# Patient Record
Sex: Female | Born: 1965 | Race: White | Hispanic: No | Marital: Married | Smoking: Former smoker
Health system: Southern US, Community
[De-identification: ages and names within clinical notes are randomized; demographics above are authoritative.]

## PROBLEM LIST (undated history)

## (undated) ENCOUNTER — Emergency Department: Admission: EM | Discharge status: Absent without leave | Payer: Self-pay | Source: Home / Self Care

## (undated) HISTORY — PX: TONSILLECTOMY: SUR1361

## (undated) HISTORY — PX: OTHER SURGICAL HISTORY: SHX169

## (undated) HISTORY — PX: ABDOMINAL HYSTERECTOMY: SHX81

## (undated) HISTORY — PX: BREAST LUMPECTOMY: SHX2

---

## 1998-02-19 ENCOUNTER — Other Ambulatory Visit: Admission: RE | Admit: 1998-02-19 | Discharge: 1998-02-19 | Payer: Self-pay | Admitting: Obstetrics and Gynecology

## 1999-02-20 ENCOUNTER — Other Ambulatory Visit: Admission: RE | Admit: 1999-02-20 | Discharge: 1999-02-20 | Payer: Self-pay | Admitting: Obstetrics and Gynecology

## 2000-02-23 ENCOUNTER — Other Ambulatory Visit: Admission: RE | Admit: 2000-02-23 | Discharge: 2000-02-23 | Payer: Self-pay | Admitting: Obstetrics and Gynecology

## 2000-11-09 ENCOUNTER — Encounter (INDEPENDENT_AMBULATORY_CARE_PROVIDER_SITE_OTHER): Payer: Self-pay | Admitting: Specialist

## 2000-11-09 ENCOUNTER — Inpatient Hospital Stay (HOSPITAL_COMMUNITY): Admission: AD | Admit: 2000-11-09 | Discharge: 2000-11-12 | Payer: Self-pay | Admitting: Obstetrics and Gynecology

## 2000-12-22 ENCOUNTER — Other Ambulatory Visit: Admission: RE | Admit: 2000-12-22 | Discharge: 2000-12-22 | Payer: Self-pay | Admitting: Obstetrics and Gynecology

## 2002-01-30 ENCOUNTER — Other Ambulatory Visit: Admission: RE | Admit: 2002-01-30 | Discharge: 2002-01-30 | Payer: Self-pay | Admitting: Obstetrics and Gynecology

## 2003-03-07 ENCOUNTER — Other Ambulatory Visit: Admission: RE | Admit: 2003-03-07 | Discharge: 2003-03-07 | Payer: Self-pay | Admitting: Obstetrics and Gynecology

## 2003-11-07 ENCOUNTER — Encounter (INDEPENDENT_AMBULATORY_CARE_PROVIDER_SITE_OTHER): Payer: Self-pay | Admitting: Specialist

## 2003-11-07 ENCOUNTER — Observation Stay (HOSPITAL_COMMUNITY): Admission: RE | Admit: 2003-11-07 | Discharge: 2003-11-08 | Payer: Self-pay | Admitting: Obstetrics and Gynecology

## 2004-04-21 ENCOUNTER — Other Ambulatory Visit: Admission: RE | Admit: 2004-04-21 | Discharge: 2004-04-21 | Payer: Self-pay | Admitting: Obstetrics and Gynecology

## 2004-05-18 ENCOUNTER — Encounter: Payer: Self-pay | Admitting: Family Medicine

## 2005-03-31 ENCOUNTER — Ambulatory Visit: Payer: Self-pay | Admitting: Family Medicine

## 2005-08-14 ENCOUNTER — Other Ambulatory Visit: Admission: RE | Admit: 2005-08-14 | Discharge: 2005-08-14 | Payer: Self-pay | Admitting: Obstetrics and Gynecology

## 2006-04-01 ENCOUNTER — Encounter: Payer: Self-pay | Admitting: Family Medicine

## 2006-10-27 ENCOUNTER — Ambulatory Visit (HOSPITAL_BASED_OUTPATIENT_CLINIC_OR_DEPARTMENT_OTHER): Admission: RE | Admit: 2006-10-27 | Discharge: 2006-10-27 | Payer: Self-pay | Admitting: Urology

## 2008-02-28 ENCOUNTER — Encounter: Admission: RE | Admit: 2008-02-28 | Discharge: 2008-02-28 | Payer: Self-pay | Admitting: Obstetrics and Gynecology

## 2010-07-30 ENCOUNTER — Ambulatory Visit (INDEPENDENT_AMBULATORY_CARE_PROVIDER_SITE_OTHER): Payer: Self-pay | Admitting: Family Medicine

## 2010-07-30 ENCOUNTER — Encounter: Payer: Self-pay | Admitting: Family Medicine

## 2010-07-30 DIAGNOSIS — H1030 Unspecified acute conjunctivitis, unspecified eye: Secondary | ICD-10-CM

## 2010-07-30 DIAGNOSIS — L0201 Cutaneous abscess of face: Secondary | ICD-10-CM

## 2010-08-05 NOTE — Assessment & Plan Note (Signed)
Summary: POSS EYE INFECTION/TJ rm 4   Vital Signs:  Patient Profile:   45 Years Old Female CC:      possible pink eye/ sore in nose Height:     65 inches Weight:      177 pounds O2 Sat:      97 % O2 treatment:    Room Air Temp:     98.7 degrees F oral Pulse rate:   84 / minute Resp:     16 per minute BP sitting:   131 / 83  (left arm) Cuff size:   regular  Vitals Entered By: Clemens Catholic LPN (July 30, 2010 6:25 PM)              Vision Screening: Left eye w/o correction: 20 / 25 Right Eye w/o correction: 20 / 30 Both eyes w/o correction:  20/ 25        Vision Entered By: Clemens Catholic LPN (July 30, 2010 6:37 PM)    Updated Prior Medication List: No Medications Current Allergies: ! * BEE STINGSHistory of Present Illness Chief Complaint: possible pink eye/ sore in nose History of Present Illness:  Subjective:  Patient has two complaints: 1)  She developed mild irritation in her left eye yesterday evening, with mild foreign body sensation.  No discharge from the eye;  no change in vision.  No history of seasonal allergies. 2)  She has had a "sore" inside her left nares for about a week that had improved somewhat with application of Neosporin.  There had been redness initially, now resolved, but the nose is still sore.  No nasal congestion.  No fevers, chills, and sweats   REVIEW OF SYSTEMS Constitutional Symptoms      Denies fever, chills, night sweats, weight loss, weight gain, and fatigue.  Eyes       Complains of change in vision and eye drainage.      Denies eye pain, glasses, contact lenses, and eye surgery. Ear/Nose/Throat/Mouth       Denies hearing loss/aids, change in hearing, ear pain, ear discharge, dizziness, frequent runny nose, frequent nose bleeds, sinus problems, sore throat, hoarseness, and tooth pain or bleeding.  Respiratory       Denies dry cough, productive cough, wheezing, shortness of breath, asthma, bronchitis, and emphysema/COPD.    Cardiovascular       Denies murmurs, chest pain, and tires easily with exhertion.    Gastrointestinal       Denies stomach pain, nausea/vomiting, diarrhea, constipation, blood in bowel movements, and indigestion. Genitourniary       Denies painful urination, kidney stones, and loss of urinary control. Neurological       Denies paralysis, seizures, and fainting/blackouts. Musculoskeletal       Denies muscle pain, joint pain, joint stiffness, decreased range of motion, redness, swelling, muscle weakness, and gout.  Skin       Denies bruising, unusual mles/lumps or sores, and hair/skin or nail changes.  Psych       Denies mood changes, temper/anger issues, anxiety/stress, speech problems, depression, and sleep problems. Other Comments: pt c/o LT eye redness, itching, and pressure x today. 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 she also c/o having a sore inside her nose since October.   Past History:  Past Medical History: _ Unremarkable  Past Surgical History: c/sec  8/98, Hysterectomy - Partial, Left Br Lumpectomy Tonsillectomy bladder sling lasik  Family History: Reviewed history from 02/23/2006 and no changes  required. Alcoholism-Father, Br Ca-Mother, GM, Depression-Mother, DM-Dad, HTN-Mom  Social History: Reviewed history from 02/23/2006 and no changes required. 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   Married to [employer], 2 children.  Quit smoking in Jan 1998, rare EtOH, no drugs, 5 caffeinated drinks per day, no regular exercise.   Objective:  Appearance:  Patient appears healthy, stated age, and in no acute distress  Eyes:  Pupils are equal, round, and reactive to light and accomdation.  Extraocular movement is intact.  Conjunctivae are not inflamed.  Fundi normal.  Left lid eversion reveals no foreign body.  No photophobia.  Fluorescein to the left eye reveals no uptake.  No left lid  tenderness, erythema, or swelling. Ears:  Canals normal.  Tympanic membranes normal.   Nose:  No external swelling or erythema, but mildly tender to palpation.  Left nares reveals some mild swelling at medial aspect of vestibule.  No sinus tenderness Mouth/pharynx:  normal Neck:  Supple.  No adenopathy is present.   Assessment New Problems: CELLULITIS AND ABSCESS OF FACE (ICD-682.0) CONJUNCTIVITIS, ACUTE, LEFT (ICD-372.00)  SUSPECT VIRAL CONJUNCTIVITIS; POSSIBLE EARLY VIRAL URI  Plan New Medications/Changes: DOXYCYCLINE HYCLATE 100 MG CAPS (DOXYCYCLINE HYCLATE) One by mouth two times a day  #14 x 0, 07/30/2010, Donna Christen MD  New Orders: New Patient Level III [phone] Planning Comments:   Recommend applying cooled eye lubricating drops several times daily. Call if develops increased eye redness and purulent drainage (consider ophthalmic antibiotic) Given a Netter patient information and instruction sheet on topic conjunctivitis. Begin doxycycline for cellulitis nose.  Begin applying warm compresses several times daily.   The patient and/or caregiver has been counseled thoroughly with regard to medications prescribed including dosage, schedule, interactions, rationale for use, and possible side effects and they verbalize understanding.  Diagnoses and expected course of recovery discussed and will return if not improved as expected or if the condition worsens. Patient and/or caregiver verbalized understanding.  Prescriptions: DOXYCYCLINE HYCLATE 100 MG CAPS (DOXYCYCLINE HYCLATE) One by mouth two times a day  #14 x 0   Entered and Authorized by:   Donna Christen MD   Signed by:   Donna Christen MD on 07/30/2010   Method used:   Print then Give to Patient   RxID:   [id number]   Patient Instructions: 1)  Apply warm compresses to nose 3 or 4 times daily. 2)  May use Visine or similar drops in affected eye. 3)  If cold-like symptoms develop: 4)  Take Mucinex D (guaifenesin with  decongestant) twice daily for congestion. 5)  Increase fluid intake, rest. 6)  Use Delsym cough suppressant at bedtime. 7)  May use Afrin nasal spray (or generic oxymetazoline) twice daily for about 5 days.  Also recommend using saline nasal spray several times daily and/or saline nasal irrigation. 8)  Followup with family doctor if not improving 7 to 10 days.   Orders Added: 1)  New Patient Level III [69629]

## 2010-09-05 ENCOUNTER — Inpatient Hospital Stay (INDEPENDENT_AMBULATORY_CARE_PROVIDER_SITE_OTHER)
Admission: RE | Admit: 2010-09-05 | Discharge: 2010-09-05 | Disposition: A | Discharge status: Home / Self Care | Payer: BC Managed Care – PPO | Source: Ambulatory Visit | Attending: Emergency Medicine | Admitting: Emergency Medicine

## 2010-09-05 ENCOUNTER — Encounter: Payer: Self-pay | Admitting: Emergency Medicine

## 2010-09-05 DIAGNOSIS — L0201 Cutaneous abscess of face: Secondary | ICD-10-CM

## 2010-09-16 ENCOUNTER — Inpatient Hospital Stay (INDEPENDENT_AMBULATORY_CARE_PROVIDER_SITE_OTHER)
Admission: RE | Admit: 2010-09-16 | Discharge: 2010-09-16 | Disposition: A | Discharge status: Home / Self Care | Payer: BC Managed Care – PPO | Source: Ambulatory Visit | Attending: Emergency Medicine | Admitting: Emergency Medicine

## 2010-09-16 ENCOUNTER — Encounter: Payer: Self-pay | Admitting: Emergency Medicine

## 2010-09-16 DIAGNOSIS — L2089 Other atopic dermatitis: Secondary | ICD-10-CM | POA: Insufficient documentation

## 2010-09-30 NOTE — Op Note (Signed)
NAME:  [name], [name] NO.:  [id number]   MEDICAL RECORD NO.:  [id number]          PATIENT TYPE:  AMB   LOCATION:  [facility]                         FACILITY:  [facility]   PHYSICIAN:  Valetta Fuller, M.D.  DATE OF BIRTH:  Jun 26, 1965   DATE OF PROCEDURE:  10/27/2006  DATE OF DISCHARGE:                               OPERATIVE REPORT   PREOPERATIVE DIAGNOSIS:  Stress urinary incontinence.   POSTOPERATIVE DIAGNOSIS:  Stress urinary incontinence.   PROCEDURE PERFORMED:  [employer] suburethral sling.   SURGEON:  Valetta Fuller, M.D.   ANESTHESIA:  General.   INDICATIONS:  Ms. Crystal Carney is a 45 year old female.  She came to see  me because of progressive stress incontinence.  That leakage was  beginning to affect her quality of life.  She had done conservative  treatments without success.  The patient had urodynamics which confirmed  objective stress incontinence with a Valsalva leak point pressure of 80  cm of water pressure and moderate incontinence.  She did have some  hypersensitivity with mild bladder instability and a somewhat reduced  functional bladder capacity.  We discussed at length her treatment  options and she elected to have suburethral sling.  She appeared to  understand the risks and benefits of this procedure, the success rates  and potential for continued incontinence or potential urinary retention  issues.  She presents now for the procedure.   TECHNIQUE AND FINDINGS:  The patient was brought to the operating room  where she had successful induction of general anesthesia.  She was  placed in the mid lithotomy position and prepped and draped in usual  manner.  A weighted vaginal speculum was used a Foley catheter was  placed to completely drain the bladder.  Anterior vaginal mucosa over  the urethra was injected.  A small midline incision at the mid urethra  was then performed.  The vaginal planes were established just to the  point we  could palpate underneath the retropubic space/endopelvic  fascia.  With the catheter and bladder drained, two small stab incisions  were made just above the pubic symphysis just lateral to the midline.  With direct fingertip control the needles were then placed out both  incisions lateral to the urethra.  The Foley catheter was then removed  and flexible cystoscopy was performed.  The needles appeared to be in  good position.  There was no evidence of any bladder injury.  With the  right angle clamp at the mid urethra, the suburethral sling was then  placed at the mid urethra.  The access sheath was cut by cutting the tab  and the sheath was removed off the sling.  The extra sling was then cut  at the level of the skin.  A little vaginal incision was copiously  irrigated.  The vaginal incision was closed with 2-0 Vicryl suture in a  running manner.  The retropubic  incisions were closed with Dermabond.  Some vaginal packing was utilized  with bacitracin.  The patient was then brought to recovery room in  stable condition.  Sponge and needle counts were correct and  the patient  had no obvious complications.           ______________________________  Valetta Fuller, M.D.  Electronically Signed     DSG/MEDQ  D:  10/27/2006  T:  10/27/2006  Job:  [id number]

## 2010-09-30 NOTE — Op Note (Signed)
NAME:  Crystal Carney, Crystal Carney NO.:  [id number]   MEDICAL RECORD NO.:  [id number]          PATIENT TYPE:  AMB   LOCATION:  [facility]                         [facility]ACILITY:  [facility]   PHYSICIAN:  Valetta Fuller, M.D.  DATE OF BIRTH:  12-27-1965   DATE OF PROCEDURE:  10/27/2006  DATE OF DISCHARGE:                               OPERATIVE REPORT           ______________________________  Valetta Fuller, M.D.     DSG/MEDQ  D:  10/27/2006  T:  10/27/2006  Job:  [id number]

## 2010-10-03 NOTE — Op Note (Signed)
[facility]  Patient:    Crystal Carney, Crystal Carney                 MRN: [id number] Proc. Date: 11/09/00 Adm. Date:  [id number] Attending:  Trevor Iha                           Operative Report  PREOPERATIVE DIAGNOSES:       1. Intrauterine pregnancy at 39 weeks.                               2. Previous cesarean section for repeat.                               3. Desires sterility.  POSTOPERATIVE DIAGNOSES:      1. Intrauterine pregnancy at 39 weeks.                               2. Previous cesarean section for repeat.                               3. Desires sterility.  PROCEDURES:                   1. Repeat low cervical transverse cesarean                                  section.                               2. Bilateral [place] tubal ligation.  SURGEON:                      Trevor Iha, M.D.  ANESTHESIA:                   Spinal.  ESTIMATED BLOOD LOSS:         800 cc.  INDICATIONS:                  The patient is a 45 year old G2, P1 at 4 weeks estimated gestational age.  her pregnancy was uncomplicated.  She declined amniocentesis and AFP.  Estimated date of confinement was November 16, 2000.  She presents today for repeat cesarean section and sterilization.  The risks and benefits were discussed at length.  informed consent was obtained, which includes a 09/998 tubal failure.  FINDINGS AT THE TIME OF SURGERY:              Viable female infant.  Apgars 9 and 9.  Arterial pH 7.31.  Weight 8 lb 14 oz.   DESCRIPTION OF PROCEDURE:     After adequate analgesia, the patient was placed in the supine position with a left lateral tilt.  She was sterilely prepped and draped.  The bladder was sterilely drained.  A Pfannenstiel skin incision was made through the previous incision.  It was taken down sharply to the fascia, which was incised transversely and extended to and inferiorly off the bellies of the rectus muscles, which were  separated sharply in the midline. The peritoneum was entered  sharply.  A bladder blade was placed.  The uterine serosa was elevated, nicked and incised transversely.  A bladder flap was created and placed behind the bladder blade.  A low cervical myotomy incision was made down to the infants vertex.  It was extended laterally with the operators fingertips.  The infants vertex was then delivered atraumatically. The nares and pharynx were suctioned.  The infant was then delivered.  The cord was clamped and the infant was handed to the pediatrician.  Good cry was noted.  Cord blood was obtained.  The placenta was extracted manually.  The uterus was exteriorized and wiped clean with a dry lap.  The myotomy incision was closed in two layers, the first being a running locking layer of 0 Monocryl, the second being an imbricating layer  with good approximation and good hemostasis achieved.  The fallopian tubes were identified by the fimbriated ends, the mid portion of the tubes were grasped with Babcock clamps.  A window in the mesosalpinx was noted.  A hole in the mesosalpinx was made with Bovie cautery.  Two sutures of 0 plain were passed.  It was doubly ligated in the central portion and approximately 1.5 cm excised.  A suture of silk was placed on the proximal portion of the tube and Bovie cautery was used to make the ostia hemostatic.  This was done similarly on both sides. Re-examination of the myotomy incision revealed good hemostasis.  The uterus was placed back in the peritoneal cavity after another look at the fallopian tubes and the myotomy incision.  Everything was hemostatic.  Irrigation was applied.  At this time, the peritoneum was closed with 0 Monocryl.  The rectus muscle was plicated in the midline.  Irrigation was once again applied.  After adequate hemostasis, the fascia was closed with 0 Panacryl in running fashion. Irrigation was once again applied.  After adequate hemostasis,  the skin was stapled and Steri-Strips applied.  The patient received 1 g of cefotetan after delivery of the placenta.  Sponge, needle and instrument counts were normal x 3.  Estimated blood loss was 800 cc.  The patient was stable on transfer to the recovery room. DD:  11/09/00 TD:  11/09/00 Job: [id number]

## 2010-10-03 NOTE — Discharge Summary (Signed)
NAME:  Crystal Carney, Crystal Carney                    ACCOUNT NO.:  [id number]   MEDICAL RECORD NO.:  [id number]                   PATIENT TYPE:  OBV   LOCATION:  9306                                 FACILITY:  [facility]   PHYSICIAN:  Dineen Kid. Rana Snare, M.D.                 DATE OF BIRTH:  [birth year]   DATE OF ADMISSION:  11/07/2003  DATE OF DISCHARGE:  11/08/2003                                 DISCHARGE SUMMARY   Ms. Cleckley is a 45 year old G2, P2 with continued problems with  bleeding, dyspareunia, pelvic pain, which has not been responsive to  conservative medical management, including oral contraceptive agents.  She  had a normal sonohystogram.  She continued to have pelvic pain and desires  hysterectomy.  Presents for laparoscopic-assisted vaginal hysterectomy.   HOSPITAL COURSE:  Patient was admitted under same-day surgery.  She  underwent a LAVH which was uncomplicated with an estimated blood loss of 150  cc.  Her postoperative care was unremarkable.  Good return of bowel  function.  She was ambulating and tolerated a regular diet on postoperative  day #1 with good bowel function and bowel sounds.  Incisions were clean,  dry, and intact.   Patient was discharged home in stable condition.   DISPOSITION:  Patient will be discharged home with followup in the office in  1-2 weeks.  Sent home with a prescription for Tylox.  Postoperative  hemoglobin is 10.3.                                               Dineen Kid Rana Snare, M.D.    DCL/MEDQ  D:  11/08/2003  T:  11/08/2003  Job:  [id number]

## 2010-10-03 NOTE — Op Note (Signed)
NAME:  Crystal Carney, Crystal Carney                    ACCOUNT NO.:  [id number]   MEDICAL RECORD NO.:  [id number]                   PATIENT TYPE:  OBV   LOCATION:  9399                                 FACILITY:  [facility]   PHYSICIAN:  Dineen Kid. Rana Snare, M.D.                 DATE OF BIRTH:  Jun 14, 1965   DATE OF PROCEDURE:  11/07/2003  DATE OF DISCHARGE:                                 OPERATIVE REPORT   PREOPERATIVE DIAGNOSES:  1. Abnormal uterine bleeding.  2. Pelvic pain.  3. Dyspareunia.   POSTOPERATIVE DIAGNOSES:  1. Abnormal uterine bleeding.  2. Pelvic pain.  3. Dyspareunia.   PROCEDURE:  Laparoscopically assisted vaginal hysterectomy.   SURGEON:  Dineen Kid. Rana Snare, M.D.   ASSISTANT:  Raynald Kemp, M.D.   ANESTHESIA:  General endotracheal anesthesia.   INDICATIONS FOR PROCEDURE:  Ms. Resnick is a 45 year old G2, P2 with  continued problems with abnormal bleeding, dyspareunia, and pelvic pain  which has not been responsive to conservative management including oral  contraceptive agents and a normal sonohistogram.  She desires definitive  surgical intervention, desires hysterectomy and presents today for planned  laparoscopically assisted vaginal hysterectomy.  The risks and benefits were  discussed at length which include, but are not limited to, risk of  infection, bleeding, damage to bowel, bladder or ureters, tubes, ovaries,  risk that this may not alleviate the pain and could actually worsen, also  risks associated with anesthesia and bleeding or blood transfusion.  She  gives her informed consent.   FINDINGS:  Normal appendix, liver, gallbladder.  The uterus, tubes and  ovaries also appear to be normal.  Tubal ligation also appears to be  appropriate.   DESCRIPTION OF PROCEDURE:  After adequate analgesia, the patient was placed  in the dorsal lithotomy position.  She is sterilely prepped and draped.  The  bladder sterilely drained.  Graves speculum was placed.  A Hulka  tenaculum  was placed in the anterior lip of the cervix.  A 1 cm infraumbilical skin  incision  was made. A Veress needle  was inserted.  The abdomen was  insufflated with dullness to percussion.  An 11 mm trocar was inserted.  A 5  mm trocar was inserted to the left of the midline two fingerbreadths above  the pubic symphysis under direct visualization.  The above findings were  noted.  A Gyrus tripolar LigaSure was used to ligate along the right utero-  ovarian pedicle down across the utero-ovarian ligament through the round  ligament and the inferior portion of the broad ligament with good hemostasis  achieved and dissecting after coagulation. This is done similarly along the  left utero-ovarian ligament down below the round ligament with the ovaries  and tubes following laterally with good hemostasis achieved.  The bladder  was then elevated, dissected free from the uterus at the ureterovesical  junction and small bladder flap created.  The abdomen was then desufflated,  legs were repositioned.  Posterior colpotomy was then performed.  Cervix was  circumscribed with Bovie cautery.  A Gyrus bipolar Heaney clamp was placed  across the uterosacral ligaments bilaterally, coagulated and cut with Mayo  scissors and done similarly across the cardinal ligaments and the bladder  pillars.  The bladder was then dissected off the anterior surface of the  cervix.  The uterine vasculature was then clamped and ligated and dissected  and the uterus was dissected up to the inferior portion of the broad  ligament and the bladder flap had been created and replaced behind the  bladder blade prior to this.  The uterine fundus was then delivered into the  introitus.  The remaining pedicles were clamped, coagulated and the uterus  was removed and sent to pathology.  The uterosacral ligaments were  identified bilaterally, suture ligated with 0 Monocryl suture.  The  posterior peritoneum was then closed in a  pursestring fashion.  The  uterosacral ligaments were then plicated in the midline with 0 Monocryl  suture in a figure-of-eight fashion.  The posterior vaginal mucosa was then  closed with figure-of-eights of 0 Monocryl suture.  Small packing was then  removed from the anterior vagina and the anterior vaginal mucosa was closed  in similar fashion with figure-of-eights of 0 Monocryl suture.  Hemostasis  was achieved.  Foley catheter was then placed with good return of yellow  urine.  The legs were then repositioned and abdomen reinsufflated.  Irrigation was applied and small peritoneal bleeders were coagulated with  bipolar cautery and after copious amount of irrigation, adequate hemostasis  was assured.  The abdomen was then desufflated and the trocars removed.  The  infraumbilical skin incision  was closed with 0 Vicryl in the fascia in an  interrupted suture and a 3-0 Vicryl subcuticular stitch in the umbilicus and  also in the 5 mm site with good approximation and good hemostasis.  The  incision was then injected with 0.25% Marcaine, a total of 10 mL used.  The  patient was stable on transfer to the recovery room.  Sponge, needle and  instrument counts were correct x3.   ESTIMATED BLOOD LOSS:  150 mL.   The patient received 1 g of Rocephin preoperatively.                                               Dineen Kid Rana Snare, M.D.    DCL/MEDQ  D:  11/07/2003  T:  11/07/2003  Job:  [id number]

## 2010-10-03 NOTE — H&P (Signed)
NAME:  Crystal Carney, Crystal Carney                    ACCOUNT NO.:  [id number]   MEDICAL RECORD NO.:  [id number]                   PATIENT TYPE:  OBV   LOCATION:  NA                                   FACILITY:  [facility]   PHYSICIAN:  Dineen Kid. Rana Snare, M.D.                 DATE OF BIRTH:  [birth year]   DATE OF ADMISSION:  11/07/2003  DATE OF DISCHARGE:                                HISTORY & PHYSICAL   HISTORY OF PRESENT ILLNESS:  Ms. Brines is a 45 year old, G2, P2 with  continued problems with abnormal uterine bleeding, dyspareunia, pelvic pain  which has not been responsive to conservative medical management.  She has  tried oral contraceptive agents which she has failed. She continues to have  pain with intercourse, had a normal sonohysterogram and desires definitive  surgical intervention and planned laparoscopically assisted vaginal  hysterectomy.   PAST MEDICAL HISTORY:  Negative.   PAST SURGICAL HISTORY:  She had a lumpectomy for the left breast, she has  had tonsillectomy, she has had two cesarean sections.  She has had a cyst  from her neck which was removed.  She has also had a tubal ligation after  cesarean section.   PHYSICAL EXAMINATION:  VITAL SIGNS:  Her blood pressure is 108/78.  HEART:  Regular rate and rhythm.  LUNGS:  Clear to auscultation bilaterally.  ABDOMEN:  Nondistended, nontender.  PELVIC:  The uterus is anteverted, mobile, nontender, no adnexal masses are  palpable.   IMPRESSION:  Abnormal uterine bleeding, pelvic pain, dyspareunia not  responsive to conservative medical management. The patient desires  definitive surgical treatment, she  requests hysterectomy.   PLAN:  Laparoscopically assisted vaginal hysterectomy.  The risks and  benefits were discussed at length which include but not limited to risk of  infection, bleeding, damage to bowel, bladder, ureters, possibility this may  not alleviate the pelvic pain, risks associated with anesthesia, risks  associated with bleeding, blood transfusion.  She does give her informed  consent and wishes to proceed. We will preserve her ovaries at the time of  surgery.                                               Dineen Kid Rana Snare, M.D.    DCL/MEDQ  D:  11/06/2003  T:  11/06/2003  Job:  [id number]

## 2010-10-03 NOTE — Discharge Summary (Signed)
[facility]  Patient:    Crystal Carney, Crystal Carney                 MRN: [id number] Adm. Date:  [id number] Disc. Date: [id number] Attending:  Trevor Iha Dictator:   Danie Chandler, R.N.                           Discharge Summary  ADMITTING DIAGNOSES:          1. Intrauterine pregnancy at [redacted] weeks gestation.                               2. Previous cesarean section for repeat.                               3. Desires sterility.  DISCHARGE DIAGNOSES:          1. Intrauterine pregnancy at [redacted] weeks gestation.                               2. Previous cesarean section for repeat.                               3. Desires sterility.  PROCEDURE:                    On November 09, 2000 repeat low cervical transverse cesarean section and bilateral tubal ligation.  REASON FOR ADMISSION:         Please see dictated H&P.  HOSPITAL COURSE:              The patient was taken to the operating room and underwent the above named procedure without complications.  This was productive of a viable female infant with Apgars of 9 at one minute, 9 at five minutes, and an arterial cord pH of 7.31.  Postoperatively on day #1 the patients hemoglobin was 9.1, hematocrit 26.5, white blood cell count 9.9. The patient had a good return of bowel function on this day and was tolerating a regular diet.  On postoperative day #2 the patient was ambulating well without difficulty, had good pain control.  She was discharged home on postoperative day #3.  CONDITION ON DISCHARGE:       Good.  DIET:                         Regular, as tolerated.  ACTIVITY:                     No heavy lifting, no driving, no vaginal entry.  DISCHARGE INSTRUCTIONS:       She is to follow-up in the office in one to two weeks.  She is to call for temperature greater than 100 degrees, persistent nausea or vomiting, heavy vaginal bleeding, and/or redness or drainage from the incision site.  DISCHARGE  MEDICATIONS:        1. Prenatal vitamins one p.o. q.d.                               2. Tylox one to two p.o. q.6h. p.r.n. pain. DD:  12/03/00 TD:  12/03/00 Job: [id number]

## 2010-10-03 NOTE — H&P (Signed)
[facility]  Patient:    Crystal Carney, Crystal Carney                   MRN: [id number] Adm. Date:  11/09/00 Attending:  Trevor Iha, M.D.                         History and Physical  DATE OF BIRTH:                27-Oct-1965  HISTORY OF PRESENT ILLNESS:   Ms. Niemeier is a 45 year old, G2, P1 at 73 weeks estimated gestational age who presents for a repeat cesarean section and tubal ligation. The pregnancy has been relatively uncomplicated. Her estimated date of confinement is November 16, 2000. She does have advanced maternal age, had a decline in the amniocentesis and AFP, had a normal ultrasound. Group B strep was negative. She desires sterilization at the time of surgery.  PAST MEDICAL HISTORY:         Negative.  PAST SURGICAL HISTORY:        History of breast lumpectomy, tonsillectomy, cesarean section in 1998 for CPD.  MEDICATIONS:                  Prenatal vitamins.  ALLERGIES:                    No known drug allergies.  PHYSICAL EXAMINATION:  VITAL SIGNS:                  Blood pressure is 120/72.  HEART:                        Regular rate and rhythm.  LUNGS:                        Clear to auscultation bilaterally.  ABDOMEN:                      Gravid nontender.  CERVIX:                       Closed thick and high.  IMPRESSION/PLAN:                   Intrauterine pregnancy at 39 weeks, previous cesarean section, desires repeat. The patient and her husband also adamantly desire sterilization. Risks and benefits were discussed at length including but not limited to risk of infection, bleeding, damage to bowel, bladder, uterus, tubes, ovaries, also risk of tubal failure quoted at 09/998. The patient gives her informed consent. DD:  11/08/00 TD:  11/08/00 Job: [id number]

## 2011-03-05 LAB — POCT HEMOGLOBIN-HEMACUE: Hemoglobin: 13.4

## 2011-04-20 NOTE — Progress Notes (Signed)
Summary: RASH/TJ rm 4   Vital Signs:  Patient Profile:   45 Years Old Female CC:      rash all over her body x 1 day Height:     65 inches Weight:      177.25 pounds O2 Sat:      96 % O2 treatment:    Room Air Temp:     98.6 degrees F oral Pulse rate:   93 / minute Resp:     18 per minute BP sitting:   122 / 80  (left arm) Cuff size:   regular  Vitals Entered By: Clemens Catholic LPN (Sep 16, 9602 3:08 PM)                  Updated Prior Medication List: BACTRIM DS 800-160 MG TABS (SULFAMETHOXAZOLE-TRIMETHOPRIM) 1 by mouth two times a day for 2 weeks  Current Allergies (reviewed today): ! * BEE STINGSHistory of Present Illness Chief Complaint: rash all over her body x 1 day History of Present Illness: Widespread body rash for 1 day.  Very itchy. Doesn't recall any new soaps, shampoos, detergents, pets, travel, etc.  Was on Bactrim DS for about 11 days for a nasal abscess.  No previous allergic reactions.  Benedryl helps.  No SOB, throat swelling, dyspnea.  Not worsening since this morning.  REVIEW OF SYSTEMS Constitutional Symptoms      Denies fever, chills, night sweats, weight loss, weight gain, and fatigue.  Eyes       Denies change in vision, eye pain, eye discharge, glasses, contact lenses, and eye surgery. Ear/Nose/Throat/Mouth       Denies hearing loss/aids, change in hearing, ear pain, ear discharge, dizziness, frequent runny nose, frequent nose bleeds, sinus problems, sore throat, hoarseness, and tooth pain or bleeding.  Respiratory       Denies dry cough, productive cough, wheezing, shortness of breath, asthma, bronchitis, and emphysema/COPD.  Cardiovascular       Denies murmurs, chest pain, and tires easily with exhertion.    Gastrointestinal       Denies stomach pain, nausea/vomiting, diarrhea, constipation, blood in bowel movements, and indigestion. Genitourniary       Denies painful urination, kidney stones, and loss of urinary control. Neurological  Denies paralysis, seizures, and fainting/blackouts. Musculoskeletal       Denies muscle pain, joint pain, joint stiffness, decreased range of motion, redness, swelling, muscle weakness, and gout.  Skin       Denies bruising, unusual mles/lumps or sores, and hair/skin or nail changes.  Psych       Denies mood changes, temper/anger issues, anxiety/stress, speech problems, depression, and sleep problems. Other Comments: pt c/o rash all over x 1day.   Past History:  Past Medical History: Reviewed history from 07/30/2010 and no changes required. _ Unremarkable  Past Surgical History: Reviewed history from 07/30/2010 and no changes required. c/sec  8/98, Hysterectomy - Partial, Left Br Lumpectomy Tonsillectomy bladder sling lasik  Family History: Reviewed history from 02/23/2006 and no changes required. Alcoholism-Father, Br Ca-Mother, GM, Depression-Mother, DM-Dad, HTN-Mom  Social History: Reviewed history from 02/23/2006 and no changes required. 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   Married to [employer], 2 children.  Quit smoking in Jan 1998, rare EtOH, no drugs, 5 caffeinated drinks per day, no regular exercise. Physical Exam General appearance: well developed, well nourished, no acute distress Oral/Pharynx: tongue normal, posterior pharynx without erythema or exudate. OP patent. Skin: see below MSE: oriented to  time, place, and person Widespread maculopapular rash over whole body sparing palms, soles, face.  Mild excoriations. Assessment New Problems: DERMATITIS, ATOPIC (ICD-691.8)   Plan New Medications/Changes: PREDNISONE (PAK) 10 MG TABS (PREDNISONE) 6 day pack, use as directed  #1 x 0, 09/16/2010, Hoyt Koch MD  New Orders: Est. Patient Level III [phone] Solumedrol up to 125mg  [J2930] Pulse Oximetry (single measurment) [94760] Admin of Therapeutic Inj  intramuscular or subcutaneous [96372] Planning Comments:    Not sure if this is allergic skin reaction due to the Bactrim or atopic reaction to something else.  I have advised her to stop the antibiotic since she has been treated long enough.  Because she isn't improving per her opinion, will send her to ENT for that since they may need to biopsy.  Gave IM Solumedrol and Rx for oral pred taper.  Benedryl can be used as needed, cool showers, hydration.  If worsening, SOB, go to ER.    The patient and/or caregiver has been counseled thoroughly with regard to medications prescribed including dosage, schedule, interactions, rationale for use, and possible side effects and they verbalize understanding.  Diagnoses and expected course of recovery discussed and will return if not improved as expected or if the condition worsens. Patient and/or caregiver verbalized understanding.  Prescriptions: PREDNISONE (PAK) 10 MG TABS (PREDNISONE) 6 day pack, use as directed  #1 x 0   Entered and Authorized by:   Hoyt Koch MD   Signed by:   Hoyt Koch MD on 09/16/2010   Method used:   Print then Give to Patient   RxID:   [id number]   Medication Administration  Injection # 1:    Medication: Solumedrol up to 125mg     Diagnosis: DERMATITIS, ATOPIC (ICD-691.8)    Route: IM    Site: RUOQ gluteus    Exp Date: 05/18/2013    Lot #: [id number]    Mfr: Pharmacia    Patient tolerated injection without complications    Given by: Clemens Catholic LPN (Sep 15, 9560 3:27 PM)  Orders Added: 1)  Est. Patient Level III [13086] 2)  Solumedrol up to 125mg  [J2930] 3)  Pulse Oximetry (single measurment) [94760] 4)  Admin of Therapeutic Inj  intramuscular or subcutaneous [57846]

## 2011-04-20 NOTE — Progress Notes (Signed)
Summary: INFECTION IN NOSE/TJ Room 4   Vital Signs:  Patient Profile:   45 Years Old Female CC:      Sore in nose x 7 months Height:     65 inches Weight:      177 pounds O2 Sat:      100 % O2 treatment:    Room Air Temp:     98.6 degrees F oral Pulse rate:   79 / minute Pulse rhythm:   regular Resp:     12 per minute BP sitting:   127 / 80  (left arm) Cuff size:   regular  Vitals Entered By: Emilio Math (September 05, 2010 4:12 PM)                  Current Allergies (reviewed today): ! * BEE STINGSHistory of Present Illness History from: patient Chief Complaint: Sore in nose x 7 months History of Present Illness: She had a sore inside her L nare last month, treated with Doxy.  Was getting better, then stopped the medicine and it came back.  Neosporin helps, keeping nares moist helps.  No F/C.  There had been redness and soreness.  No nasal congestion.  No other abscesses.  REVIEW OF SYSTEMS Constitutional Symptoms      Denies fever, chills, night sweats, weight loss, weight gain, and fatigue.  Eyes       Denies change in vision, eye pain, eye discharge, glasses, contact lenses, and eye surgery. Ear/Nose/Throat/Mouth       Denies hearing loss/aids, change in hearing, ear pain, ear discharge, dizziness, frequent runny nose, frequent nose bleeds, sinus problems, sore throat, hoarseness, and tooth pain or bleeding.  Respiratory       Denies dry cough, productive cough, wheezing, shortness of breath, asthma, bronchitis, and emphysema/COPD.  Cardiovascular       Denies murmurs, chest pain, and tires easily with exhertion.    Gastrointestinal       Denies stomach pain, nausea/vomiting, diarrhea, constipation, blood in bowel movements, and indigestion. Genitourniary       Denies painful urination, kidney stones, and loss of urinary control. Neurological       Denies paralysis, seizures, and fainting/blackouts. Musculoskeletal       Denies muscle pain, joint pain, joint  stiffness, decreased range of motion, redness, swelling, muscle weakness, and gout.  Skin       Complains of unusual moles/lumps or sores.      Denies bruising and hair/skin or nail changes.      Comments: in nose Psych       Denies mood changes, temper/anger issues, anxiety/stress, speech problems, depression, and sleep problems.  Past History:  Past Medical History: Reviewed history from 07/30/2010 and no changes required. _ Unremarkable  Past Surgical History: Reviewed history from 07/30/2010 and no changes required. c/sec  8/98, Hysterectomy - Partial, Left Br Lumpectomy Tonsillectomy bladder sling lasik  Family History: Reviewed history from 02/23/2006 and no changes required. Alcoholism-Father, Br Ca-Mother, GM, Depression-Mother, DM-Dad, HTN-Mom  Social History: Reviewed history from 02/23/2006 and no changes required. 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   Married to [employer], 2 children.  Quit smoking in Jan 1998, rare EtOH, no drugs, 5 caffeinated drinks per day, no regular exercise. Physical Exam General appearance: well developed, well nourished, no acute distress Ears: normal, no lesions or deformities Nasal: see below Oral/Pharynx: tongue normal, posterior pharynx without erythema or exudate Chest/Lungs: no rales, wheezes, or rhonchi bilateral, breath  sounds equal without effort Heart: regular rate and  rhythm, no murmur Skin: no obvious rashes or lesions MSE: oriented to time, place, and person Nose: No external swelling, mild erythema, swelling L medial aspect or vestibule with tenderness to palpation.  Left nares reveals some mild swelling at medial aspect of vestibule.  No sinus tenderness Assessment New Problems: CELLULITIS AND ABSCESS OF FACE (ICD-682.0)   Patient Education: Patient and/or caregiver instructed in the following: rest, fluids, Ibuprofen prn.  Plan New Medications/Changes: BACTRIM DS  800-160 MG TABS (SULFAMETHOXAZOLE-TRIMETHOPRIM) 1 by mouth two times a day for 2 weeks  #28 x 8, 09/05/2010, Hoyt Koch MD  New Orders: Est. Patient Level III [phone] Planning Comments:   I don't see anything to I&D.  No evidence of septal hematoma.  Will treat with 2 weeks of Bactrim.   Warm compress, do not pick or irritate or squeeze the area.  Hold off on Bactroban for now since that will stop the drainage.  If worsening in the next few days, may need to consider further treatment or ENT referral.   May consider swab for MRSA nasal colonization if further abscesses.   The patient and/or caregiver has been counseled thoroughly with regard to medications prescribed including dosage, schedule, interactions, rationale for use, and possible side effects and they verbalize understanding.  Diagnoses and expected course of recovery discussed and will return if not improved as expected or if the condition worsens. Patient and/or caregiver verbalized understanding.  Prescriptions: BACTRIM DS 800-160 MG TABS (SULFAMETHOXAZOLE-TRIMETHOPRIM) 1 by mouth two times a day for 2 weeks  #28 x 8   Entered and Authorized by:   Hoyt Koch MD   Signed by:   Hoyt Koch MD on 09/05/2010   Method used:   Print then Give to Patient   RxID:   [id number]   Orders Added: 1)  Est. Patient Level III [95284]

## 2011-06-15 ENCOUNTER — Emergency Department (INDEPENDENT_AMBULATORY_CARE_PROVIDER_SITE_OTHER)
Admission: EM | Admit: 2011-06-15 | Discharge: 2011-06-15 | Disposition: A | Discharge status: Home / Self Care | Payer: BC Managed Care – PPO | Source: Home / Self Care | Attending: Family Medicine | Admitting: Family Medicine

## 2011-06-15 ENCOUNTER — Encounter: Payer: Self-pay | Admitting: Emergency Medicine

## 2011-06-15 DIAGNOSIS — M940 Chondrocostal junction syndrome [Tietze]: Secondary | ICD-10-CM

## 2011-06-15 DIAGNOSIS — J069 Acute upper respiratory infection, unspecified: Secondary | ICD-10-CM

## 2011-06-15 LAB — POCT INFLUENZA A/B
Influenza A, POC: NEGATIVE
Influenza B, POC: NEGATIVE

## 2011-06-15 LAB — POCT RAPID STREP A (OFFICE): Rapid Strep A Screen: NEGATIVE

## 2011-06-15 MED ORDER — AMOXICILLIN 875 MG PO TABS: 875.0000 mg | ORAL_TABLET | Freq: Two times a day (BID) | ORAL | Status: AC

## 2011-06-15 MED ORDER — BENZONATATE 200 MG PO CAPS: 200.0000 mg | ORAL_CAPSULE | Freq: Every day | ORAL | Status: AC

## 2011-06-15 NOTE — ED Provider Notes (Signed)
History     CSN: [id number]  Arrival date & time 06/15/11  1608   First MD Initiated Contact with Patient 06/15/11 1713      Chief Complaint  Patient presents with  . Sore Throat     HPI Comments: HPI : Flu symptoms for 2 days.  Fever to 99+ with chills, sweats, myalgias, fatigue, headache. Symptoms are progressively worsening, despite trying OTC fever reducing medicine and rest and fluids. Has decreased appetite, but tolerating some liquids by mouth.  She had a flu shot less than one month ago (her daughter had documented flu).  She notes that she feels tightness in her anterior chest.  Review of Systems: Positive for fatigue, mild nasal congestion, mild sore throat, mild swollen anterior neck glands, mild cough. Negative for acute vision changes, stiff neck, focal weakness, syncope, seizures, respiratory distress, vomiting, diarrhea, GU symptoms.   Patient is a 46 y.o. female presenting with pharyngitis. The history is provided by the patient.  Sore Throat This is a new problem. The current episode started 2 days ago. The problem occurs constantly. The problem has been gradually improving. Associated symptoms include chest pain and headaches. Pertinent negatives include no abdominal pain and no shortness of breath. The symptoms are aggravated by swallowing and coughing. The symptoms are relieved by nothing.    History reviewed. No pertinent past medical history.  Past Surgical History  Procedure Date  . Cesarean section   . Bladder tack   . Tonsillectomy   . Abdominal hysterectomy     No family history on file.  History  Substance Use Topics  . Smoking status: Not on file  . Smokeless tobacco: Not on file  . Alcohol Use:     OB History    Grav Para Term Preterm Abortions TAB SAB Ect Mult Living                  Review of Systems  Respiratory: Negative for shortness of breath.   Cardiovascular: Positive for chest pain.  Gastrointestinal: Negative for abdominal  pain.  Neurological: Positive for headaches.    Allergies  Bee venom  Home Medications   Current Outpatient Rx  Name Route Sig Dispense Refill  . AMOXICILLIN 875 MG PO TABS Oral Take 1 tablet (875 mg total) by mouth 2 (two) times daily. (Rx void after 06/23/11) 20 tablet 0  . BENZONATATE 200 MG PO CAPS Oral Take 1 capsule (200 mg total) by mouth at bedtime. Take as needed for cough 12 capsule 0    BP 142/84  Pulse 101  Temp(Src) 98.3 F (36.8 C) (Oral)  Resp 16  Ht 5\' 5"  (1.651 m)  Wt 185 lb (83.915 kg)  BMI 30.79 kg/m2  SpO2 100%  Physical Exam Nursing notes and Vital Signs reviewed. Appearance:  Patient appears healthy, stated age, and in no acute distress Eyes:  Pupils are equal, round, and reactive to light and accomodation.  Extraocular movement is intact.  Conjunctivae are not inflamed  Ears:  Canals normal.  Tympanic membranes normal.  Nose:  Mildly congested turbinates.  No sinus tenderness.    Pharynx:  Minimal erythema Neck:  Supple.  Slightly tender shotty anterior/posterior nodes are palpated bilaterally  Lungs:  Clear to auscultation.  Breath sounds are equal.  Chest:  Distinct tenderness to palpation over the mid-sternum.  Heart:  Regular rate and rhythm without murmurs, rubs, or gallops.  Abdomen:  Nontender without masses or hepatosplenomegaly.  Bowel sounds are present.  No CVA or  flank tenderness.  Extremities:  No edema.  No calf tenderness Skin:  No rash present.   ED Course  Procedures  none   Labs Reviewed  POCT RAPID STREP A (OFFICE) negative  POCT INFLUENZA A/B negative      1. Acute upper respiratory infections of unspecified site   2. Costochondritis, acute       MDM  There is no evidence of bacterial infection today.   Treat symptomatically for now: Begin Tessalon at bedtime. Take plain Mucinex (guaifenesin) twice daily for cough and congestion.  May add Sudafed as needed for sinus congestion.  Increase fluid intake, rest. May use  Afrin nasal spray (or generic oxymetazoline) twice daily for about 5 days.  Also recommend using saline nasal spray several times daily and saline nasal irrigation (AYR is a common brand) Stop all antihistamines for now, and other non-prescription cough/cold preparations. May take Ibuprofen 200mg , 4 tabs every 8 hours with food for chest/sternum discomfort. Begin Amoxicillin if not improving about 5 days or if persistent fever develops (given Rx to hold) Follow-up with family doctor if not improving 7 to 10 days.         Donna Christen, MD 06/15/11 [phone]

## 2011-06-15 NOTE — ED Notes (Signed)
Sore throat, headache, ears hurt, chills, cough x 2 days

## 2011-12-15 ENCOUNTER — Emergency Department (HOSPITAL_BASED_OUTPATIENT_CLINIC_OR_DEPARTMENT_OTHER): Payer: BC Managed Care – PPO

## 2011-12-15 ENCOUNTER — Encounter (HOSPITAL_BASED_OUTPATIENT_CLINIC_OR_DEPARTMENT_OTHER): Payer: Self-pay

## 2011-12-15 ENCOUNTER — Emergency Department (HOSPITAL_BASED_OUTPATIENT_CLINIC_OR_DEPARTMENT_OTHER)
Admission: EM | Admit: 2011-12-15 | Discharge: 2011-12-15 | Disposition: A | Discharge status: Home / Self Care | Payer: BC Managed Care – PPO | Attending: Emergency Medicine | Admitting: Emergency Medicine

## 2011-12-15 DIAGNOSIS — M25519 Pain in unspecified shoulder: Secondary | ICD-10-CM | POA: Insufficient documentation

## 2011-12-15 MED ORDER — IBUPROFEN 800 MG PO TABS
800.0000 mg | ORAL_TABLET | Freq: Once | ORAL | Status: AC
  Administered 2011-12-15: 800 mg via ORAL
  Filled 2011-12-15: qty 1

## 2011-12-15 NOTE — ED Notes (Signed)
Left shoulder pain x 2 weeks-increases with pain-denies injury

## 2011-12-15 NOTE — ED Provider Notes (Addendum)
History     CSN: [id number]  Arrival date & time 12/15/11  1633   First MD Initiated Contact with Patient 12/15/11 1650      Chief Complaint  Patient presents with  . Shoulder Pain    (Consider location/radiation/quality/duration/timing/severity/associated sxs/prior treatment) Patient is a 46 y.o. female presenting with shoulder pain.  Shoulder Pain  Right shoulder pain for two weeks.  Increases with movement.  No known injury.  Patient started running two weeks ago.  No redness or swelling, neck pain, upper extremity numbness or tingling.  Patient took advil without relief.    History reviewed. No pertinent past medical history.  Past Surgical History  Procedure Date  . Cesarean section   . Bladder tack   . Tonsillectomy   . Abdominal hysterectomy     No family history on file.  History  Substance Use Topics  . Smoking status: Never Smoker   . Smokeless tobacco: Not on file  . Alcohol Use: No    OB History    Grav Para Term Preterm Abortions TAB SAB Ect Mult Living                  Review of Systems  All other systems reviewed and are negative.    Allergies  Bee venom  Home Medications   Current Outpatient Rx  Name Route Sig Dispense Refill  . DIPHENHYDRAMINE HCL 25 MG PO TABS Oral Take 50 mg by mouth every 6 (six) hours as needed. For swelling.    Marland Kitchen HYDROCORTISONE 1 % EX CREA Topical Apply 1 application topically 2 (two) times daily. For itching.    . IBUPROFEN 200 MG PO TABS Oral Take 600 mg by mouth every 6 (six) hours as needed. For pain.    Marland Kitchen ONE-DAILY MULTI VITAMINS PO TABS Oral Take 1 tablet by mouth daily.      BP 107/69  Pulse 82  Temp 98.3 F (36.8 C) (Oral)  Resp 16  Ht 5\' 5"  (1.651 m)  Wt 178 lb (80.74 kg)  BMI 29.62 kg/m2  SpO2 100%  Physical Exam  Nursing note and vitals reviewed. Constitutional: She is oriented to person, place, and time. She appears well-developed and well-nourished.  HENT:  Head: Normocephalic and  atraumatic.  Eyes: Pupils are equal, round, and reactive to light.  Neck: Normal range of motion. Neck supple.  Cardiovascular: Normal rate.   Pulmonary/Chest: Effort normal and breath sounds normal.  Abdominal: Soft.  Musculoskeletal: She exhibits no edema.       Left-Tenderness anterior shoulder, no deformity,no swelling.  Pain with internal/external rotation and abduction. Pulses intact, sensation intact.  Full arom left elbow and wrist.   Rue- normal  Neurological: She is alert and oriented to person, place, and time. She has normal reflexes.    ED Course  Procedures (including critical care time)  Labs Reviewed - No data to display Dg Shoulder Left  12/15/2011  *RADIOLOGY REPORT*  Clinical Data: History of chronic left shoulder pain.  Increasing pain over the last few days.  No known injury.  LEFT SHOULDER - 2+ VIEW  Comparison: None.  Findings: Alignment is normal.  Joint spaces are preserved.  No fracture or dislocation is evident.  No soft tissue lesions are seen.  No calcific bursitis or calcific tendonitis is evident.  No cervical rib is seen.  IMPRESSION: No left shoulder abnormality is evident.  Original Report Authenticated By: Crawford Givens, M.D.     No diagnosis found.    MDM  Patient with left shoulder pain for two weeks without injury.  Pain increases with movement.  Plan antiinflammatories, sling , and follow up with ortho.          Hilario Quarry, MD 12/15/11 1745  Hilario Quarry, MD 12/15/11 1610

## 2012-08-12 ENCOUNTER — Encounter: Payer: Self-pay | Admitting: *Deleted

## 2012-08-12 ENCOUNTER — Emergency Department
Admission: EM | Admit: 2012-08-12 | Discharge: 2012-08-12 | Disposition: A | Discharge status: Home / Self Care | Payer: BC Managed Care – PPO | Source: Home / Self Care | Attending: Family Medicine | Admitting: Family Medicine

## 2012-08-12 ENCOUNTER — Emergency Department (INDEPENDENT_AMBULATORY_CARE_PROVIDER_SITE_OTHER): Payer: Managed Care, Other (non HMO)

## 2012-08-12 DIAGNOSIS — R51 Headache: Secondary | ICD-10-CM

## 2012-08-12 MED ORDER — DEXAMETHASONE SODIUM PHOSPHATE 10 MG/ML IJ SOLN
10.0000 mg | Freq: Once | INTRAMUSCULAR | Status: AC
  Administered 2012-08-12: 10 mg via INTRAMUSCULAR

## 2012-08-12 NOTE — ED Notes (Signed)
Pt c/o LT sided HA x 2 days.

## 2012-08-12 NOTE — ED Provider Notes (Signed)
History     CSN: [id number]  Arrival date & time 08/12/12  1120   First MD Initiated Contact with Patient 08/12/12 1129      Chief Complaint  Patient presents with  . Headache   HPI    Pt presents today with chief complaint of headache.   2 days ago, was at night. Predominantly L sided with nasal congestion. No runny nose or purulent drainage. Also with posterior neck. Pt reports hx/o ? Migraines in high school (photophobia and nausea-relieved darvocet and rest). Symptoms not similar. Pt also with monocular vision. Pt does not report being under a significant amount of stress. Mild photophobia. No nausea. Dark light seems to make in better. Pain 7/10 in intensity. Has had difficulty with sleep 2/2 headache but unsure if HA has woken her up from sleep. No hemiparesis, confusion, slurred speech.   Red Flags for imaging? Acute headache: yes  Worst headache of life: no  Thunderclap headache: no  Chronic-progressive pattern (steadily worsening over time) Focal neurologic symptoms: no  Abnormal neurologic examination: no  Papilledema: no  Abnormal eye movements: no  Hemiparesis: no   Ataxia: no  Abnormal reflexes Presence of ventriculoperitoneal shunt: no  Presence of neurocutaneous syndrome (neurofibromatosis or tuberous sclerosis): no  Age younger than three years: no    History reviewed. No pertinent past medical history.  Past Surgical History  Procedure Laterality Date  . Cesarean section    . Bladder tack    . Tonsillectomy    . Abdominal hysterectomy      No family history on file.  History  Substance Use Topics  . Smoking status: Never Smoker   . Smokeless tobacco: Not on file  . Alcohol Use: No    OB History   Grav Para Term Preterm Abortions TAB SAB Ect Mult Living                  Review of Systems  All other systems reviewed and are negative.    Allergies  Bee venom  Home Medications   Current Outpatient Rx  Name  Route  Sig  Dispense   Refill  . diphenhydrAMINE (BENADRYL) 25 MG tablet   Oral   Take 50 mg by mouth every 6 (six) hours as needed. For swelling.         . hydrocortisone cream 1 %   Topical   Apply 1 application topically 2 (two) times daily. For itching.         Marland Kitchen ibuprofen (ADVIL,MOTRIN) 200 MG tablet   Oral   Take 600 mg by mouth every 6 (six) hours as needed. For pain.         . Multiple Vitamin (MULTIVITAMIN) tablet   Oral   Take 1 tablet by mouth daily.           BP 136/85  Pulse 78  Temp(Src) 98.1 F (36.7 C) (Oral)  SpO2 97%  Physical Exam  Vitals reviewed. Constitutional: She appears well-developed and well-nourished.  HENT:  Head: Normocephalic and atraumatic.  Right Ear: External ear normal.  Mild maxillary TTP L>R  EOMI    Eyes: Conjunctivae are normal. Pupils are equal, round, and reactive to light.  Neck: Normal range of motion. Neck supple.  Cardiovascular: Normal rate and regular rhythm.   Pulmonary/Chest: Effort normal and breath sounds normal.  Abdominal: Soft.  Musculoskeletal: Normal range of motion.  Neurological: She is alert.  Skin: Skin is warm.    ED Course  Procedures (including critical  care time)  Labs Reviewed - No data to display Ct Head Wo Contrast  08/12/2012  *RADIOLOGY REPORT*  Clinical Data:  New onset headache  CT HEAD WITHOUT CONTRAST  Technique:  Contiguous axial images were obtained from the base of the skull through the vertex without contrast  Comparison:  None.  Findings:  The brain has a normal appearance without evidence for hemorrhage, acute infarction, hydrocephalus, or mass lesion.  There is no extra axial fluid collection.  The skull and paranasal sinuses are normal.  IMPRESSION: Normal CT of the head without contrast.   Original Report Authenticated By: Janeece Riggers, M.D.      1. Headache       MDM  Ddx remains relatively broad. No intracranial abnormalities on CT. Prior history of headache seems fairly consistent with  migraine. ? Sinusitis vs. Cluster headache. Decadron 10mg  IM x1 for treatment. No nausea.  Discussed with pt, that if sxs worsen, she may warrant blood work and possible treatment for sinusitis.  Discussed neuro and infectious red flags at length.  Follow up as needed.     The patient and/or caregiver has been counseled thoroughly with regard to treatment plan and/or medications prescribed including dosage, schedule, interactions, rationale for use, and possible side effects and they verbalize understanding. Diagnoses and expected course of recovery discussed and will return if not improved as expected or if the condition worsens. Patient and/or caregiver verbalized understanding.               Doree Albee, MD 08/17/12 [phone]

## 2012-08-14 ENCOUNTER — Telehealth: Payer: Self-pay | Admitting: Emergency Medicine

## 2013-07-01 ENCOUNTER — Encounter: Payer: Self-pay | Admitting: Emergency Medicine

## 2013-07-01 ENCOUNTER — Emergency Department
Admission: EM | Admit: 2013-07-01 | Discharge: 2013-07-01 | Disposition: A | Discharge status: Home / Self Care | Payer: Managed Care, Other (non HMO) | Source: Home / Self Care | Attending: Family Medicine | Admitting: Family Medicine

## 2013-07-01 DIAGNOSIS — R5383 Other fatigue: Secondary | ICD-10-CM

## 2013-07-01 DIAGNOSIS — R6883 Chills (without fever): Secondary | ICD-10-CM

## 2013-07-01 DIAGNOSIS — R059 Cough, unspecified: Secondary | ICD-10-CM

## 2013-07-01 DIAGNOSIS — R69 Illness, unspecified: Principal | ICD-10-CM

## 2013-07-01 DIAGNOSIS — J029 Acute pharyngitis, unspecified: Secondary | ICD-10-CM

## 2013-07-01 DIAGNOSIS — J111 Influenza due to unidentified influenza virus with other respiratory manifestations: Secondary | ICD-10-CM

## 2013-07-01 DIAGNOSIS — R05 Cough: Secondary | ICD-10-CM

## 2013-07-01 DIAGNOSIS — R5381 Other malaise: Secondary | ICD-10-CM

## 2013-07-01 MED ORDER — OSELTAMIVIR PHOSPHATE 75 MG PO CAPS: 75.0000 mg | ORAL_CAPSULE | Freq: Two times a day (BID) | ORAL | Status: DC

## 2013-07-01 NOTE — ED Notes (Signed)
Crystal Carney complains of fevers, chills, sweats, body aches, right ear pain, headaches, sore throat, runny nose with yellow discharge and cough. She reports the cough is productive in the morning and dry at night.

## 2013-07-01 NOTE — Discharge Instructions (Signed)
Take plain Mucinex (1200 mg guaifenesin) twice daily for cough and congestion.  May add Sudafed for sinus congestion.   Increase fluid intake, rest. May use Afrin nasal spray (or generic oxymetazoline) twice daily for about 5 days.  Also recommend using saline nasal spray several times daily and saline nasal irrigation (AYR is a common brand) Try warm salt water gargles for sore throat.  May take Delsym Cough Suppressant at bedtime for nighttime cough.  Stop all antihistamines for now, and other non-prescription cough/cold preparations. May take Ibuprofen 200mg , 4 tabs every 8 hours with food for chest/sternum discomfort, headache, etc.   Follow-up with family doctor if not improving about 6 days..    Influenza, Adult Influenza ("the flu") is a viral infection of the respiratory tract. It occurs more often in winter months because people spend more time in close contact with one another. Influenza can make you feel very sick. Influenza easily spreads from person to person (contagious). CAUSES  Influenza is caused by a virus that infects the respiratory tract. You can catch the virus by breathing in droplets from an infected person's cough or sneeze. You can also catch the virus by touching something that was recently contaminated with the virus and then touching your mouth, nose, or eyes. SYMPTOMS  Symptoms typically last 4 to 10 days and may include:  Fever.  Chills.  Headache, body aches, and muscle aches.  Sore throat.  Chest discomfort and cough.  Poor appetite.  Weakness or feeling tired.  Dizziness.  Nausea or vomiting. DIAGNOSIS  Diagnosis of influenza is often made based on your history and a physical exam. A nose or throat swab test can be done to confirm the diagnosis. RISKS AND COMPLICATIONS You may be at risk for a more severe case of influenza if you smoke cigarettes, have diabetes, have chronic heart disease (such as heart failure) or lung disease (such as asthma),  or if you have a weakened immune system. Elderly people and pregnant women are also at risk for more serious infections. The most common complication of influenza is a lung infection (pneumonia). Sometimes, this complication can require emergency medical care and may be life-threatening. PREVENTION  An annual influenza vaccination (flu shot) is the best way to avoid getting influenza. An annual flu shot is now routinely recommended for all adults in the [place] TREATMENT  In mild cases, influenza goes away on its own. Treatment is directed at relieving symptoms. For more severe cases, your caregiver may prescribe antiviral medicines to shorten the sickness. Antibiotic medicines are not effective, because the infection is caused by a virus, not by bacteria. HOME CARE INSTRUCTIONS  Only take over-the-counter or prescription medicines for pain, discomfort, or fever as directed by your caregiver.  Use a cool mist humidifier to make breathing easier.  Get plenty of rest until your temperature returns to normal. This usually takes 3 to 4 days.  Drink enough fluids to keep your urine clear or pale yellow.  Cover your mouth and nose when coughing or sneezing, and wash your hands well to avoid spreading the virus.  Stay home from work or school until your fever has been gone for at least 1 full day. SEEK MEDICAL CARE IF:   You have chest pain or a deep cough that worsens or produces more mucus.  You have nausea, vomiting, or diarrhea. SEEK IMMEDIATE MEDICAL CARE IF:   You have difficulty breathing, shortness of breath, or your skin or nails turn bluish.  You have severe  neck pain or stiffness.  You have a severe headache, facial pain, or earache.  You have a worsening or recurring fever.  You have nausea or vomiting that cannot be controlled. MAKE SURE YOU:  Understand these instructions.  Will watch your condition.  Will get help right away if you are not doing well or get  worse. Document Released: 05/01/2000 Document Revised: 11/03/2011 Document Reviewed: 08/03/2011 [facility] Patient Information 2014 [place], [place].

## 2013-07-01 NOTE — ED Provider Notes (Signed)
CSN: [id number]     Arrival date & time 07/01/13  1305 History   First MD Initiated Contact with Patient 07/01/13 1421     Chief Complaint  Patient presents with  . Fever    x 2 days  . Generalized Body Aches    x 2 days  . Otalgia    x 2 days  . Cough    x 2 days        HPI Comments: 48 hours ago patient developed fatigue, myalgias, mild sore throat, headache, chills, sinus congestion, and cough.  She feels tight in her anterior chest.  The history is provided by the patient.    History reviewed. No pertinent past medical history. Past Surgical History  Procedure Laterality Date  . Cesarean section    . Bladder tack    . Tonsillectomy    . Abdominal hysterectomy    . Breast lumpectomy     Family History  Problem Relation Age of Onset  . Hypertension Mother   . Cancer Mother     breast  . Diabetes Father   . Cancer Other     breast   History  Substance Use Topics  . Smoking status: Former Smoker -- 1.00 packs/day for 10 years    Types: Cigarettes  . Smokeless tobacco: Never Used  . Alcohol Use: No   OB History   Grav Para Term Preterm Abortions TAB SAB Ect Mult Living                 Review of Systems + sore throat + cough No pleuritic pain No wheezing + nasal congestion + post-nasal drainage No sinus pain/pressure No itchy/red eyes ? earache No hemoptysis No SOB + fever, + chills No nausea No vomiting No abdominal pain No diarrhea No urinary symptoms No skin rash + fatigue No myalgias + headache Used OTC meds without relief    Allergies  Bee venom  Home Medications   Current Outpatient Rx  Name  Route  Sig  Dispense  Refill  . ibuprofen (ADVIL,MOTRIN) 200 MG tablet   Oral   Take 600 mg by mouth every 6 (six) hours as needed. For pain.         . Multiple Vitamin (MULTIVITAMIN) tablet   Oral   Take 1 tablet by mouth daily.         . Pseudoephedrine-Ibuprofen 30-200 MG TABS   Oral   Take by mouth.         .  diphenhydrAMINE (BENADRYL) 25 MG tablet   Oral   Take 50 mg by mouth every 6 (six) hours as needed. For swelling.         . hydrocortisone cream 1 %   Topical   Apply 1 application topically 2 (two) times daily. For itching.         Marland Kitchen oseltamivir (TAMIFLU) 75 MG capsule   Oral   Take 1 capsule (75 mg total) by mouth every 12 (twelve) hours.   10 capsule   0    BP 137/85  Pulse 100  Temp(Src) 98.4 F (36.9 C) (Oral)  Ht 5\' 5"  (1.651 m)  Wt 195 lb (88.451 kg)  BMI 32.45 kg/m2  SpO2 98% Physical Exam Nursing notes and Vital Signs reviewed. Appearance:  Patient appears stated age, and in no acute distress.  Patient is obese (BMI 32.5) Eyes:  Pupils are equal, round, and reactive to light and accomodation.  Extraocular movement is intact.  Conjunctivae are not inflamed  Ears:  Canals normal.  Tympanic membranes normal.  Nose:  Mildly congested turbinates.  No sinus tenderness.  Pharynx:  Normal Neck:  Supple.   Tender enlarged posterior nodes are palpated bilaterally  Chest:  Distinct tenderness to palpation over the mid-sternum.  Lungs:  Clear to auscultation.  Breath sounds are equal.  Heart:  Regular rate and rhythm without murmurs, rubs, or gallops.  Abdomen:  Nontender without masses or hepatosplenomegaly.  Bowel sounds are present.  No CVA or flank tenderness.  Extremities:  No edema.  No calf tenderness Skin:  No rash present.   ED Course  Procedures  none       MDM   Final diagnoses:  Influenza-like illness    Begin Tamiflu Take plain Mucinex (1200 mg guaifenesin) twice daily for cough and congestion.  May add Sudafed for sinus congestion.   Increase fluid intake, rest. May use Afrin nasal spray (or generic oxymetazoline) twice daily for about 5 days.  Also recommend using saline nasal spray several times daily and saline nasal irrigation (AYR is a common brand) Try warm salt water gargles for sore throat.  May take Delsym Cough Suppressant at bedtime for  nighttime cough.  Stop all antihistamines for now, and other non-prescription cough/cold preparations. May take Ibuprofen 200mg , 4 tabs every 8 hours with food for chest/sternum discomfort, headache, etc.   Follow-up with family doctor if not improving about 6 days.Lattie Haw.     Stephen A Beese, MD 07/02/13 [phone]

## 2013-07-26 IMAGING — CT CT HEAD W/O CM
2 series · 16 of 30 positions shown, 20 images · non-contrast
Comparison: None.

CLINICAL DATA: New onset headache

CT HEAD WITHOUT CONTRAST
TECHNIQUE: Contiguous axial images were obtained from the base of
the skull through the vertex without contrast

[Series 2: head w/o · axial · non-contrast · 0.49mm/px · z∈[-113,+27]mm · 13 of 32 slices shown, 17 images]
[im 3/32  brain]
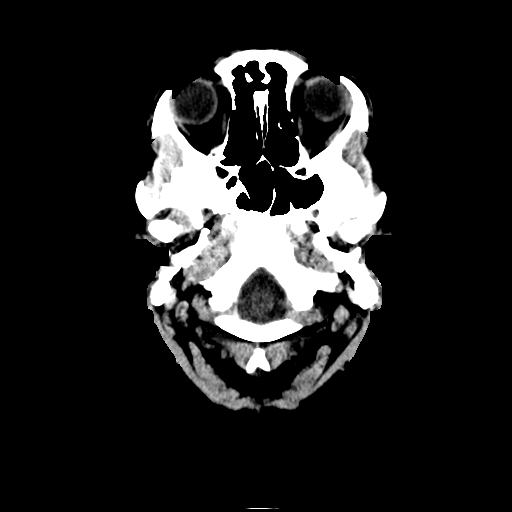
[im 3/32  bone]
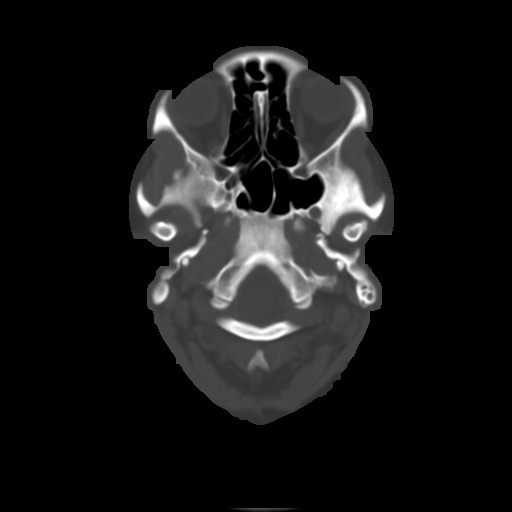
[im 5/32  brain]
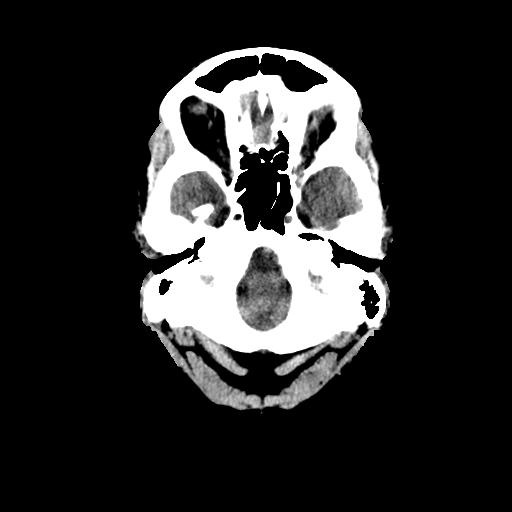
[im 7/32  brain]
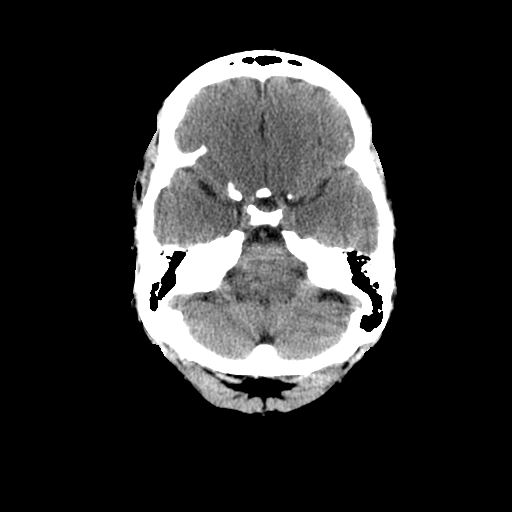
[im 9/32  brain]
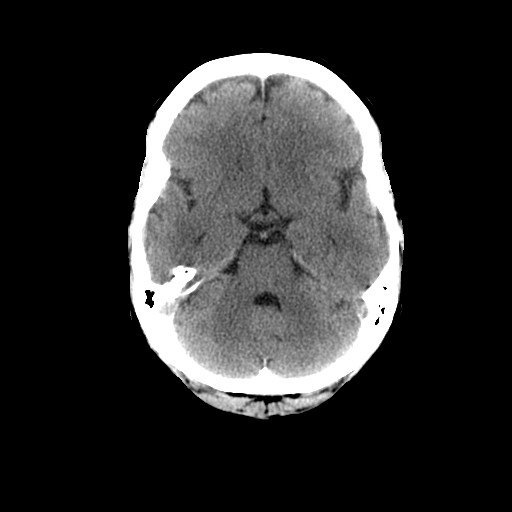
[im 12/32  brain]
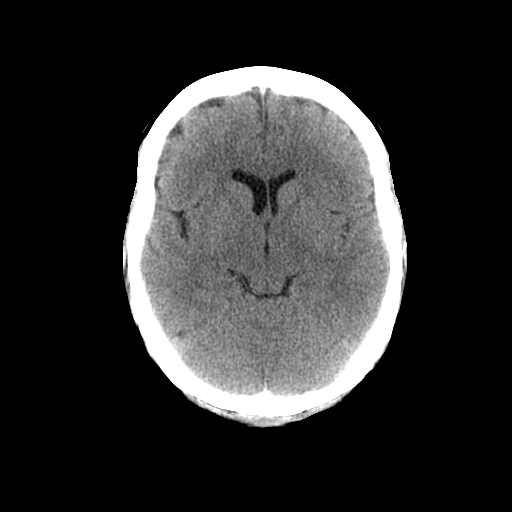
[im 12/32  bone]
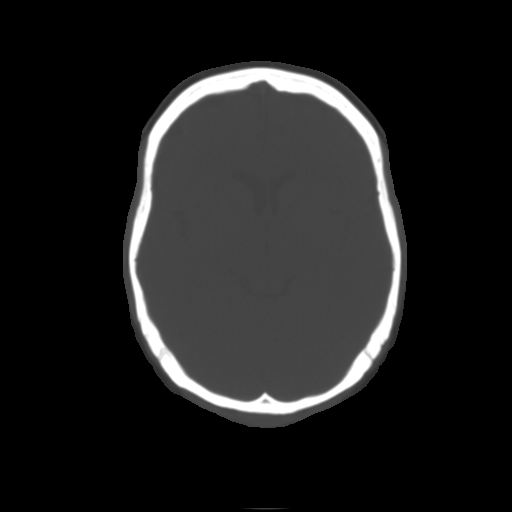
[im 14/32  brain]
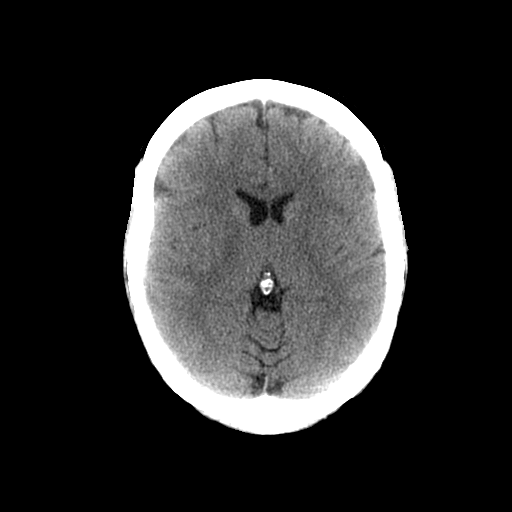
[im 16/32  brain]
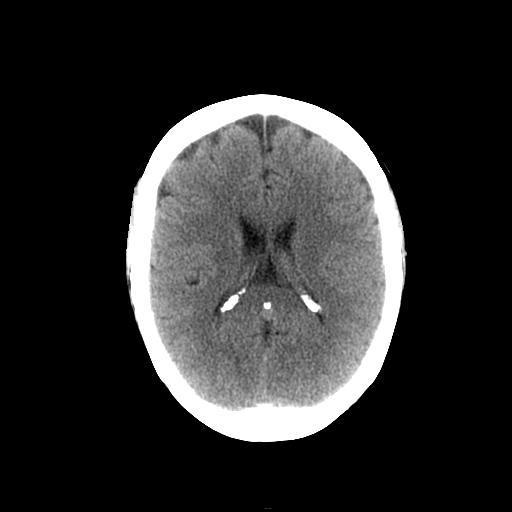
[im 18/32  brain]
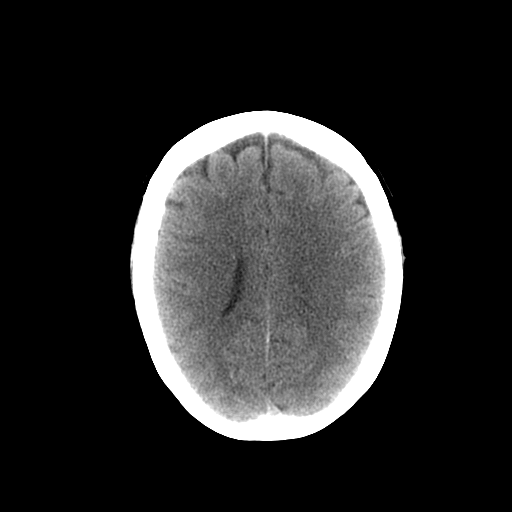
[im 20/32  brain]
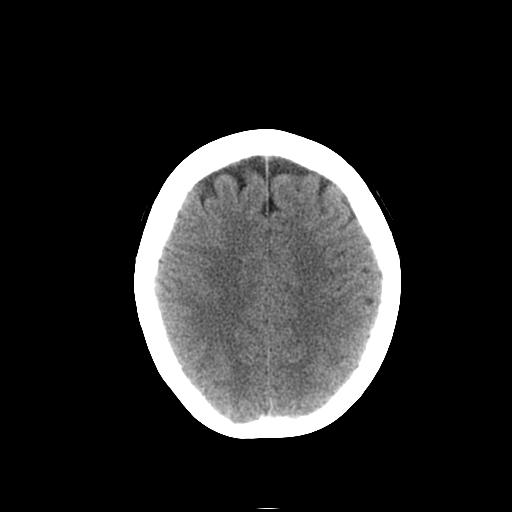
[im 20/32  bone]
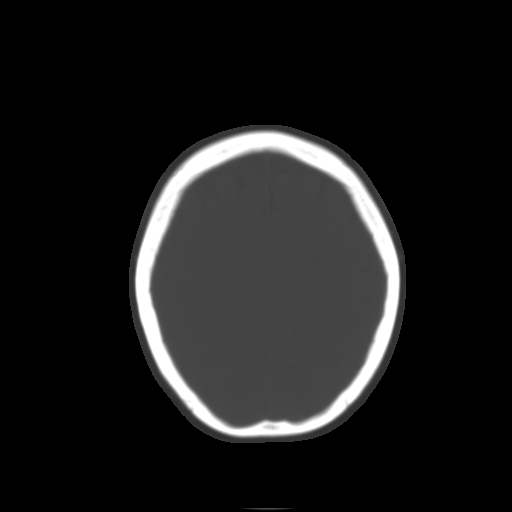
[im 23/32  brain]
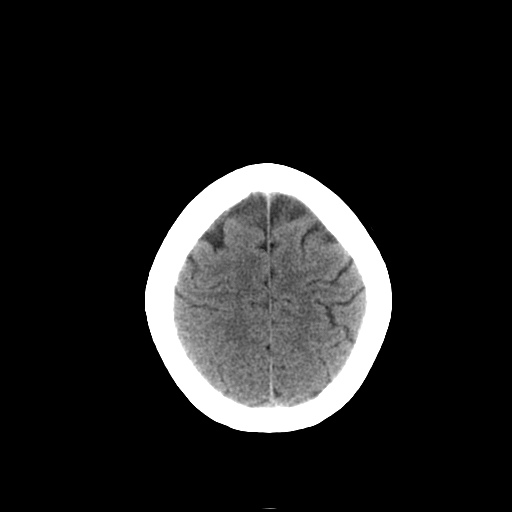
[im 25/32  brain]
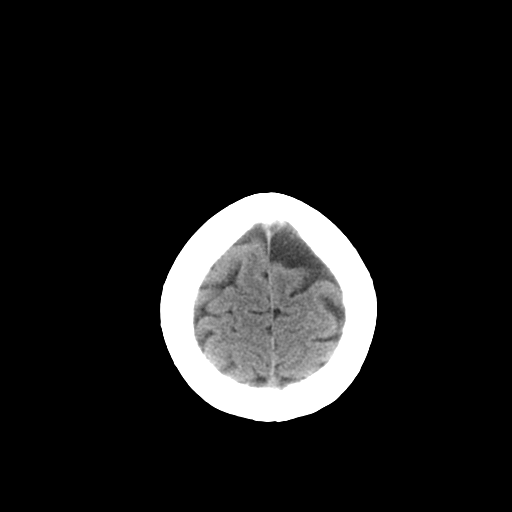
[im 27/32  brain]
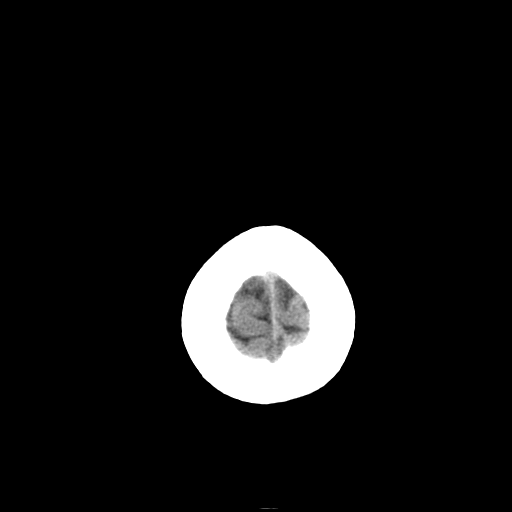
[im 29/32  brain]
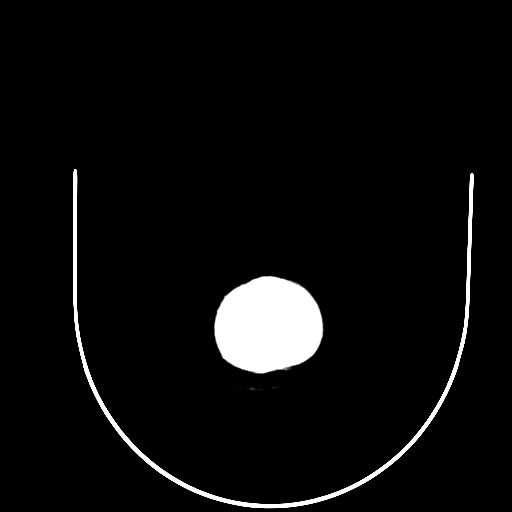
[im 29/32  bone]
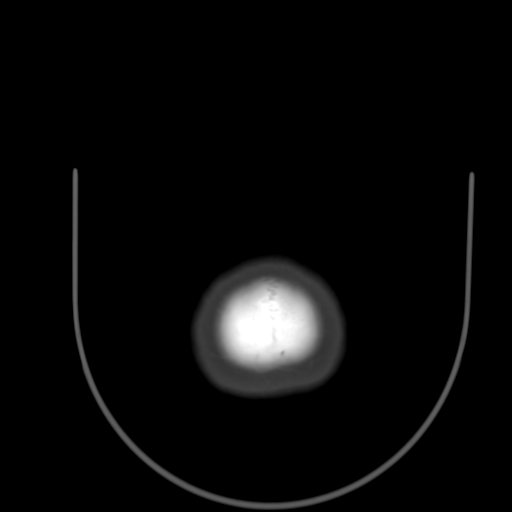

[Series 3: head bone · axial · 0.49mm/px · z∈[-113,-65]mm · 3 of 32 slices shown]
[im 3/32  bone]
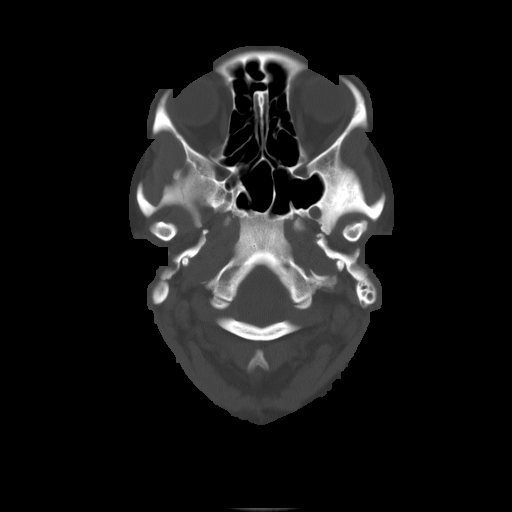
[im 7/32  bone]
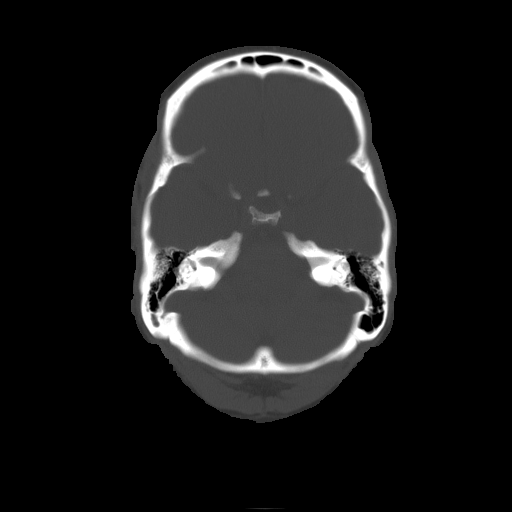
[im 12/32  bone]
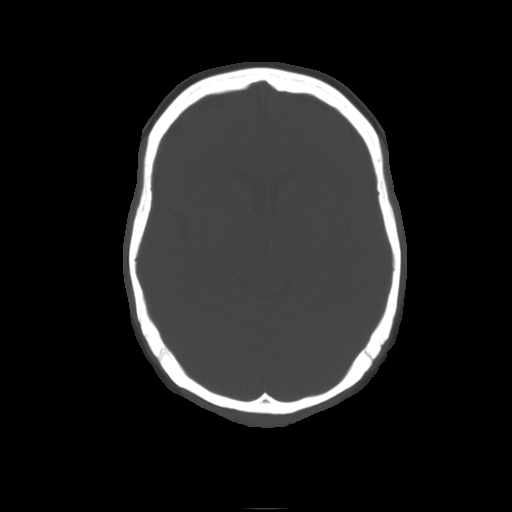

[16 of 30 positions shown; findings below may reference images not displayed]

FINDINGS: The brain has a normal appearance without evidence for
hemorrhage, acute infarction, hydrocephalus, or mass lesion.  There
is no extra axial fluid collection.  The skull and paranasal
sinuses are normal.
IMPRESSION: Normal CT of the head without contrast.

## 2015-05-17 ENCOUNTER — Emergency Department
Admission: EM | Admit: 2015-05-17 | Discharge: 2015-05-17 | Disposition: A | Discharge status: Home / Self Care | Payer: BLUE CROSS/BLUE SHIELD | Source: Home / Self Care | Attending: Family Medicine | Admitting: Family Medicine

## 2015-05-17 ENCOUNTER — Encounter: Payer: Self-pay | Admitting: *Deleted

## 2015-05-17 DIAGNOSIS — J069 Acute upper respiratory infection, unspecified: Secondary | ICD-10-CM

## 2015-05-17 DIAGNOSIS — J040 Acute laryngitis: Secondary | ICD-10-CM

## 2015-05-17 DIAGNOSIS — J019 Acute sinusitis, unspecified: Secondary | ICD-10-CM | POA: Diagnosis not present

## 2015-05-17 MED ORDER — BENZONATATE 100 MG PO CAPS: 100.0000 mg | ORAL_CAPSULE | Freq: Three times a day (TID) | ORAL | Status: DC

## 2015-05-17 MED ORDER — PREDNISONE 20 MG PO TABS: ORAL_TABLET | ORAL | Status: DC

## 2015-05-17 MED ORDER — AMOXICILLIN-POT CLAVULANATE 875-125 MG PO TABS: 1.0000 | ORAL_TABLET | Freq: Two times a day (BID) | ORAL | Status: DC

## 2015-05-17 NOTE — ED Notes (Signed)
Pt c/o 1 1/2 weeks of productive cough, sinus pressure and congestion and eye drainage. Afebrile.

## 2015-05-17 NOTE — Discharge Instructions (Signed)
You may take 400-600mg  Ibuprofen (Motrin) every 6-8 hours for fever and pain  Alternate with Tylenol  You may take 500mg  Tylenol every 4-6 hours as needed for fever and pain  Follow-up with your primary care provider next week for recheck of symptoms if not improving.  Be sure to drink plenty of fluids and rest, at least 8hrs of sleep a night, preferably more while you are sick. Return urgent care or go to closest ER if you cannot keep down fluids/signs of dehydration, fever not reducing with Tylenol, difficulty breathing/wheezing, stiff neck, worsening condition, or other concerns (see below)  Please take antibiotics as prescribed and be sure to complete entire course even if you start to feel better to ensure infection does not come back.   Cool Mist Vaporizers Vaporizers may help relieve the symptoms of a cough and cold. They add moisture to the air, which helps mucus to become thinner and less sticky. This makes it easier to breathe and cough up secretions. Cool mist vaporizers do not cause serious burns like hot mist vaporizers, which may also be called steamers or humidifiers. Vaporizers have not been proven to help with colds. You should not use a vaporizer if you are allergic to mold. HOME CARE INSTRUCTIONS  Follow the package instructions for the vaporizer.  Do not use anything other than distilled water in the vaporizer.  Do not run the vaporizer all of the time. This can cause mold or bacteria to grow in the vaporizer.  Clean the vaporizer after each time it is used.  Clean and dry the vaporizer well before storing it.  Stop using the vaporizer if worsening respiratory symptoms develop.   This information is not intended to replace advice given to you by your health care provider. Make sure you discuss any questions you have with your health care provider.   Document Released: 01/30/2004 Document Revised: 05/09/2013 Document Reviewed: 09/21/2012 Elsevier Interactive Patient  Education 2016 [employer]  Laryngitis Laryngitis is swelling (inflammation) of your vocal cords. This causes hoarseness, coughing, loss of voice, sore throat, or a dry throat. When your vocal cords are inflamed, your voice sounds different. Laryngitis can be temporary (acute) or long-term (chronic). Most cases of acute laryngitis improve with time. Chronic laryngitis is laryngitis that lasts for more than three weeks. HOME CARE  Drink enough fluid to keep your pee (urine) clear or pale yellow.  Breathe in moist air. Use a humidifier if you live in a dry climate.  Take medicines only as told by your doctor.  Do not smoke cigarettes or electronic cigarettes. If you need help quitting, ask your doctor.  Talk as little as possible. Also avoid whispering, which can cause vocal strain.  Write instead of talking. Do this until your voice is back to normal. GET HELP IF:  You have a fever.  Your pain is worse.  You have trouble swallowing. GET HELP RIGHT AWAY IF:  You cough up blood.  You have trouble breathing.   This information is not intended to replace advice given to you by your health care provider. Make sure you discuss any questions you have with your health care provider.   Document Released: 04/23/2011 Document Revised: 05/25/2014 Document Reviewed: 10/17/2013 Elsevier Interactive Patient Education [employer] Inc[employer]

## 2015-05-17 NOTE — ED Provider Notes (Signed)
CSN: [id number]     Arrival date & time 05/17/15  1316 History   First MD Initiated Contact with Patient 05/17/15 1359     Chief Complaint  Patient presents with  . Cough  . Sinus Problem   (Consider location/radiation/quality/duration/timing/severity/associated sxs/prior Treatment) HPI  Pt is a [age][facility] with [facility]/o gradually worsening productive cough with worsening sinus pressure and sinus congestion, mild intermittent sore throat with post-nasal drip, and hoarse voice.  She has been trying OTC medication with minimal relief. Pt states other family members have been sick.  She also c/o chest tightness but denies SOB at this time. No hx of asthma. Denies fever, n/v/d.   History reviewed. No pertinent past medical history. Past Surgical History  Procedure Laterality Date  . Cesarean section    . Bladder tack    . Tonsillectomy    . Abdominal hysterectomy    . Breast lumpectomy     Family History  Problem Relation Age of Onset  . Hypertension Mother   . Cancer Mother     breast  . Diabetes Father   . Cancer Other     breast   Social History  Substance Use Topics  . Smoking status: Former Smoker -- 1.00 packs/day for 10 years    Types: Cigarettes  . Smokeless tobacco: Never Used  . Alcohol Use: No   OB History    No data available     Review of Systems  Constitutional: Positive for diaphoresis. Negative for fever and chills.  HENT: Positive for rhinorrhea, sneezing, sore throat and voice change. Negative for congestion, ear pain and trouble swallowing.   Respiratory: Positive for cough, chest tightness and wheezing. Negative for shortness of breath.   Cardiovascular: Negative for chest pain and palpitations.  Gastrointestinal: Negative for nausea, vomiting, abdominal pain and diarrhea.  Musculoskeletal: Negative for myalgias, back pain and arthralgias.  Skin: Negative for rash.  Neurological: Positive for headaches.    Allergies  Bee venom  Home  Medications   Prior to Admission medications   Medication Sig Start Date End Date Taking? Authorizing Provider  Probiotic Product (PROBIOTIC ADVANCED PO) Take by mouth.   Yes Historical Provider, MD  VITAMIN D, CHOLECALCIFEROL, PO Take by mouth.   Yes Historical Provider, MD  amoxicillin-clavulanate (AUGMENTIN) 875-125 MG tablet Take 1 tablet by mouth 2 (two) times daily. One po bid x 10 days 05/17/15   Junius FinnerErin O'Malley, PA-C  benzonatate (TESSALON) 100 MG capsule Take 1 capsule (100 mg total) by mouth every 8 (eight) hours. 05/17/15   Junius FinnerErin O'Malley, PA-C  Multiple Vitamin (MULTIVITAMIN) tablet Take 1 tablet by mouth daily.    Historical Provider, MD  predniSONE (DELTASONE) 20 MG tablet 2 tabs po daily x 3 days 05/17/15   Junius FinnerErin O'Malley, PA-C   Meds Ordered and Administered this Visit  Medications - No data to display  BP 137/85 mmHg  Pulse 94  Temp(Src) 98.1 F (36.7 C) (Oral)  Resp 16  Wt 174 lb (78.926 kg)  SpO2 98% No data found.   Physical Exam  Constitutional: She appears well-developed and well-nourished. No distress.  HENT:  Head: Normocephalic and atraumatic.  Right Ear: Hearing, tympanic membrane, external ear and ear canal normal.  Left Ear: Hearing, tympanic membrane, external ear and ear canal normal.  Nose: Mucosal edema present. Right sinus exhibits maxillary sinus tenderness and frontal sinus tenderness. Left sinus exhibits frontal sinus tenderness. Left sinus exhibits no maxillary sinus tenderness.  Mouth/Throat: Uvula is midline  and mucous membranes are normal. Posterior oropharyngeal erythema present. No oropharyngeal exudate, posterior oropharyngeal edema or tonsillar abscesses.  Eyes: Conjunctivae are normal. No scleral icterus.  Neck: Normal range of motion. Neck supple.  Hoarse voice, no stridor.  Cardiovascular: Normal rate, regular rhythm and normal heart sounds.   Pulmonary/Chest: Effort normal and breath sounds normal. No stridor. No respiratory distress.  She has no wheezes. She has no rales. She exhibits no tenderness.  Intermittent productive cough on exam. No respiratory distress. Lungs: CTAB  Abdominal: Soft. Bowel sounds are normal. She exhibits no distension and no mass. There is no tenderness. There is no rebound and no guarding.  Musculoskeletal: Normal range of motion.  Lymphadenopathy:    She has no cervical adenopathy.  Neurological: She is alert.  Skin: Skin is warm and dry. She is not diaphoretic.  Nursing note and vitals reviewed.   ED Course  Procedures (including critical care time)  Labs Review Labs Reviewed - No data to display  Imaging Review No results found.    MDM   1. Acute rhinosinusitis   2. Acute upper respiratory infection   3. Laryngitis    Pt c/o worsening URI symptoms with worsening nasal congestion and sinus pressure. Due to duration of symptoms and sinus tenderness, will tx for bacterial cause.  Rx: augmentin, tessalon, and prednisone. Advised pt to use acetaminophen and ibuprofen as needed for fever and pain. Encouraged rest and fluids. F/u with PCP in 7-10 days if not improving, sooner if worsening. Pt verbalized understanding and agreement with tx plan.     Junius Finner, PA-C 05/17/15 [phone]

## 2022-03-17 ENCOUNTER — Other Ambulatory Visit: Payer: Self-pay | Admitting: Obstetrics and Gynecology

## 2022-03-17 DIAGNOSIS — Z9189 Other specified personal risk factors, not elsewhere classified: Secondary | ICD-10-CM

## 2022-05-13 ENCOUNTER — Ambulatory Visit
Admission: RE | Admit: 2022-05-13 | Discharge: 2022-05-13 | Disposition: A | Discharge status: Home / Self Care | Payer: BC Managed Care – PPO | Source: Ambulatory Visit | Attending: Obstetrics and Gynecology | Admitting: Obstetrics and Gynecology

## 2022-05-13 DIAGNOSIS — Z9189 Other specified personal risk factors, not elsewhere classified: Secondary | ICD-10-CM

## 2022-05-13 MED ORDER — GADOPICLENOL 0.5 MMOL/ML IV SOLN
10.0000 mL | Freq: Once | INTRAVENOUS | Status: AC | PRN
  Administered 2022-05-13: 10 mL via INTRAVENOUS

## 2024-05-26 ENCOUNTER — Encounter: Payer: Self-pay | Admitting: Emergency Medicine

## 2024-05-26 ENCOUNTER — Ambulatory Visit
Admission: EM | Admit: 2024-05-26 | Discharge: 2024-05-26 | Disposition: A | Discharge status: Home / Self Care | Attending: Family Medicine | Admitting: Family Medicine

## 2024-05-26 DIAGNOSIS — J0100 Acute maxillary sinusitis, unspecified: Secondary | ICD-10-CM

## 2024-05-26 MED ORDER — AMOXICILLIN-POT CLAVULANATE 875-125 MG PO TABS: 1.0000 | ORAL_TABLET | Freq: Two times a day (BID) | ORAL | 0 refills | Status: AC

## 2024-05-26 NOTE — ED Provider Notes (Signed)
 " [facility]    CSN: [id number] Arrival date & time: 05/26/24  1450      History   Chief Complaint Chief Complaint  Patient presents with   Cough    HPI Crystal Carney is a 59 y.o. female.    Cough Associated symptoms: rhinorrhea    Patient is here for uri symptoms x 1 week.  Started with runny nose, sneezing, and now with headache, pressure, and facial pain.  Having slight sore throat and right ear pain at times.   Having tenderness to the sternum.  No cough at this time.  She is taking dayquil,nyquil with some help.        History reviewed. No pertinent past medical history.  Patient Active Problem List   Diagnosis Date Noted   DERMATITIS, ATOPIC 09/16/2010    Past Surgical History:  Procedure Laterality Date   ABDOMINAL HYSTERECTOMY     bladder tack     BREAST LUMPECTOMY     CESAREAN SECTION     TONSILLECTOMY      OB History   No obstetric history on file.      Home Medications    Prior to Admission medications  Medication Sig Start Date End Date Taking? Authorizing Provider  Cyanocobalamin (VITAMIN B-12 CR PO) Take by mouth.   Yes [provider]  Multiple Vitamin (MULTIVITAMIN WITH MINERALS) TABS tablet Take 1 tablet by mouth daily.   Yes [provider]  amoxicillin -clavulanate (AUGMENTIN ) 875-125 MG tablet Take 1 tablet by mouth 2 (two) times daily. One po bid x 10 days 05/17/15   Anitra Rocky KIDD, PA-C  benzonatate  (TESSALON ) 100 MG capsule Take 1 capsule (100 mg total) by mouth every 8 (eight) hours. 05/17/15   Anitra Rocky KIDD, PA-C  Multiple Vitamin (MULTIVITAMIN) tablet Take 1 tablet by mouth daily.    [provider]  predniSONE  (DELTASONE ) 20 MG tablet 2 tabs po daily x 3 days 05/17/15   Anitra Rocky KIDD, PA-C  Probiotic Product (PROBIOTIC ADVANCED PO) Take by mouth.    [provider]  VITAMIN D, CHOLECALCIFEROL, PO Take by mouth.    [provider]    Family History Family  History  Problem Relation Age of Onset   Hypertension Mother    Cancer Mother        breast   Diabetes Father    Cancer Other        breast    Social History Social History[1]   Allergies   Bee venom   Review of Systems Review of Systems  Constitutional: Negative.   HENT:  Positive for congestion, rhinorrhea and sinus pressure.   Respiratory:  Positive for cough.   Cardiovascular: Negative.   Musculoskeletal: Negative.      Physical Exam Triage Vital Signs ED Triage Vitals  Encounter Vitals Group     BP 05/26/24 1517 (!) 151/84     Girls Systolic BP Percentile --      Girls Diastolic BP Percentile --      Boys Systolic BP Percentile --      Boys Diastolic BP Percentile --      Pulse Rate 05/26/24 1517 85     Resp 05/26/24 1517 18     Temp 05/26/24 1517 98.5 F (36.9 C)     Temp Source 05/26/24 1517 Oral     SpO2 05/26/24 1517 96 %     Weight --      Height 05/26/24 1515 5' 5 (1.651 m)  Head Circumference --      Peak Flow --      Pain Score 05/26/24 1515 6     Pain Loc --      Pain Education --      Exclude from Growth Chart --    No data found.  Updated Vital Signs BP (!) 151/84 (BP Location: Right Arm)   Pulse 85   Temp 98.5 F (36.9 C) (Oral)   Resp 18   Ht 5' 5 (1.651 m)   SpO2 96%   BMI 28.96 kg/m   Visual Acuity Right Eye Distance:   Left Eye Distance:   Bilateral Distance:    Right Eye Near:   Left Eye Near:    Bilateral Near:     Physical Exam Constitutional:      Appearance: Normal appearance. She is normal weight.  HENT:     Right Ear: Tympanic membrane normal.     Left Ear: Tympanic membrane normal.     Nose: Congestion and rhinorrhea present.     Right Sinus: Maxillary sinus tenderness present.     Left Sinus: Maxillary sinus tenderness present.     Mouth/Throat:     Mouth: Mucous membranes are moist.     Tonsils: No tonsillar exudate or tonsillar abscesses.  Cardiovascular:     Rate and Rhythm: Normal rate.   Pulmonary:     Effort: Pulmonary effort is normal.     Breath sounds: Normal breath sounds.  Musculoskeletal:     Cervical back: Normal range of motion.     Comments: + sternal tenderness  Neurological:     Mental Status: She is alert.      UC Treatments / Results  Labs (all labs ordered are listed, but only abnormal results are displayed) Labs Reviewed - No data to display  EKG   Radiology No results found.  Procedures Procedures (including critical care time)  Medications Ordered in UC Medications - No data to display  Initial Impression / Assessment and Plan / UC Course  I have reviewed the triage vital signs and the nursing notes.  Pertinent labs & imaging results that were available during my care of the patient were reviewed by me and considered in my medical decision making (see chart for details).   Final Clinical Impressions(s) / UC Diagnoses   Final diagnoses:  Acute non-recurrent maxillary sinusitis     Discharge Instructions      Patient is here for uri symptoms x 1 week.  Started with runny nose, sneezing, and now with headache, pressure, and facial pain.  Having slight sore throat and right ear pain at times.   Having tenderness to the sternum.  No cough at this time.  She is taking dayquil,nyquil with some help.      ED Prescriptions     Medication Sig Dispense Auth. Provider   amoxicillin -clavulanate (AUGMENTIN ) 875-125 MG tablet Take 1 tablet by mouth every 12 (twelve) hours. 14 tablet Darral Longs, MD      PDMP not reviewed this encounter.    [1]  Social History Tobacco Use   Smoking status: Former    Current packs/day: 1.00    Average packs/day: 1 pack/day for 10.0 years (10.0 ttl pk-yrs)    Types: Cigarettes   Smokeless tobacco: Never  Vaping Use   Vaping status: Never Used  Substance Use Topics   Alcohol use: No   Drug use: Never     Darral Longs, MD 05/26/24 1536  "

## 2024-05-26 NOTE — ED Triage Notes (Signed)
 Patient c/o chest congestion, nasal drainage, low grade fever, head pressure x 1 week.  Right ear discomfort.  Patient has taken Dayquil and Nyquil.

## 2024-05-26 NOTE — Discharge Instructions (Signed)
 Patient is here for uri symptoms x 1 week.  Started with runny nose, sneezing, and now with headache, pressure, and facial pain.  Having slight sore throat and right ear pain at times.   Having tenderness to the sternum.  No cough at this time.  She is taking dayquil,nyquil with some help.
